# Patient Record
Sex: Female | Born: 1970 | Race: White | Hispanic: Yes | Marital: Married | Smoking: Former smoker
Health system: Southern US, Community
[De-identification: ages and names within clinical notes are randomized; demographics above are authoritative.]

## PROBLEM LIST (undated history)

## (undated) DIAGNOSIS — K8020 Calculus of gallbladder without cholecystitis without obstruction: Secondary | ICD-10-CM

## (undated) DIAGNOSIS — Z789 Other specified health status: Secondary | ICD-10-CM

## (undated) DIAGNOSIS — E669 Obesity, unspecified: Secondary | ICD-10-CM

## (undated) HISTORY — PX: TUBAL LIGATION: SHX77

## (undated) HISTORY — DX: Obesity, unspecified: E66.9

---

## 2001-11-18 ENCOUNTER — Emergency Department (HOSPITAL_COMMUNITY): Admission: EM | Admit: 2001-11-18 | Discharge: 2001-11-19 | Payer: Self-pay | Admitting: Emergency Medicine

## 2001-12-04 ENCOUNTER — Encounter: Payer: Self-pay | Admitting: Emergency Medicine

## 2001-12-04 ENCOUNTER — Emergency Department (HOSPITAL_COMMUNITY): Admission: EM | Admit: 2001-12-04 | Discharge: 2001-12-04 | Payer: Self-pay | Admitting: Emergency Medicine

## 2002-07-15 ENCOUNTER — Emergency Department (HOSPITAL_COMMUNITY): Admission: EM | Admit: 2002-07-15 | Discharge: 2002-07-15 | Payer: Self-pay | Admitting: Emergency Medicine

## 2003-07-16 ENCOUNTER — Emergency Department (HOSPITAL_COMMUNITY): Admission: EM | Admit: 2003-07-16 | Discharge: 2003-07-16 | Payer: Self-pay | Admitting: Emergency Medicine

## 2003-08-17 ENCOUNTER — Other Ambulatory Visit: Admission: RE | Admit: 2003-08-17 | Discharge: 2003-08-17 | Payer: Self-pay | Admitting: Obstetrics and Gynecology

## 2004-02-10 ENCOUNTER — Inpatient Hospital Stay (HOSPITAL_COMMUNITY): Admission: AD | Admit: 2004-02-10 | Discharge: 2004-02-13 | Payer: Self-pay | Admitting: Obstetrics and Gynecology

## 2004-12-09 ENCOUNTER — Emergency Department (HOSPITAL_COMMUNITY): Admission: EM | Admit: 2004-12-09 | Discharge: 2004-12-09 | Payer: Self-pay | Admitting: Emergency Medicine

## 2005-06-17 ENCOUNTER — Other Ambulatory Visit: Admission: RE | Admit: 2005-06-17 | Discharge: 2005-06-17 | Payer: Self-pay | Admitting: Obstetrics and Gynecology

## 2005-06-18 ENCOUNTER — Emergency Department (HOSPITAL_COMMUNITY): Admission: EM | Admit: 2005-06-18 | Discharge: 2005-06-18 | Payer: Self-pay | Admitting: Emergency Medicine

## 2005-12-24 ENCOUNTER — Inpatient Hospital Stay (HOSPITAL_COMMUNITY): Admission: AD | Admit: 2005-12-24 | Discharge: 2005-12-26 | Payer: Self-pay | Admitting: Obstetrics and Gynecology

## 2005-12-25 ENCOUNTER — Encounter (INDEPENDENT_AMBULATORY_CARE_PROVIDER_SITE_OTHER): Payer: Self-pay | Admitting: Specialist

## 2006-03-18 ENCOUNTER — Emergency Department (HOSPITAL_COMMUNITY): Admission: EM | Admit: 2006-03-18 | Discharge: 2006-03-18 | Payer: Self-pay | Admitting: Emergency Medicine

## 2007-12-22 ENCOUNTER — Emergency Department (HOSPITAL_COMMUNITY): Admission: EM | Admit: 2007-12-22 | Discharge: 2007-12-22 | Payer: Self-pay | Admitting: Emergency Medicine

## 2008-05-18 ENCOUNTER — Emergency Department (HOSPITAL_COMMUNITY): Admission: EM | Admit: 2008-05-18 | Discharge: 2008-05-18 | Payer: Self-pay | Admitting: Emergency Medicine

## 2009-12-14 ENCOUNTER — Emergency Department (HOSPITAL_COMMUNITY): Admission: EM | Admit: 2009-12-14 | Discharge: 2009-12-14 | Payer: Self-pay | Admitting: Emergency Medicine

## 2009-12-14 ENCOUNTER — Emergency Department (HOSPITAL_COMMUNITY)
Admission: EM | Admit: 2009-12-14 | Discharge: 2009-12-14 | Payer: Self-pay | Source: Home / Self Care | Admitting: Emergency Medicine

## 2010-06-07 LAB — COMPREHENSIVE METABOLIC PANEL
AST: 23 U/L (ref 0–37)
Albumin: 3.3 g/dL — ABNORMAL LOW (ref 3.5–5.2)
Calcium: 8.9 mg/dL (ref 8.4–10.5)
Creatinine, Ser: 0.71 mg/dL (ref 0.4–1.2)
GFR calc Af Amer: >60 mL/min (ref >60)
Total Protein: 6.8 g/dL (ref 6.0–8.3)

## 2010-06-07 LAB — URINALYSIS, ROUTINE W REFLEX MICROSCOPIC
Bilirubin Urine: NEGATIVE
Glucose, UA: NEGATIVE mg/dL
Protein, ur: NEGATIVE mg/dL
Specific Gravity, Urine: 1.013 (ref 1.005–1.030)

## 2010-06-07 LAB — POCT PREGNANCY, URINE: Preg Test, Ur: NEGATIVE

## 2010-06-07 LAB — LIPASE, BLOOD: Lipase: 25 U/L (ref 11–59)

## 2010-06-07 LAB — DIFFERENTIAL
Eosinophils Relative: 1 % (ref 0–5)
Lymphocytes Relative: 16 % (ref 12–46)
Lymphs Abs: 1.4 10*3/uL (ref 0.7–4.0)
Monocytes Absolute: 0.5 10*3/uL (ref 0.1–1.0)
Monocytes Relative: 5 % (ref 3–12)

## 2010-06-07 LAB — URINE MICROSCOPIC-ADD ON

## 2010-06-07 LAB — CBC
MCH: 33.9 pg (ref 26.0–34.0)
MCHC: 35.1 g/dL (ref 30.0–36.0)
Platelets: 285 10*3/uL (ref 150–400)

## 2010-08-10 NOTE — Discharge Summary (Signed)
[name], [name]               ACCOUNT NO.:  [id number]   MEDICAL RECORD NO.:  [id number]          PATIENT TYPE:  INP   LOCATION:  9113                          [facility]ILITY:  [facility]   PHYSICIAN:  Zenaida Niece, M.D.DATE OF BIRTH:  1971/02/18   DATE OF ADMISSION:  02/10/2004  DATE OF DISCHARGE:                                 DISCHARGE SUMMARY   ADMISSION DIAGNOSES:  1.  Intrauterine pregnancy at 39 weeks.  2.  Positive group B streptococcus carrier.   DISCHARGE DIAGNOSES:  1.  Intrauterine pregnancy at 39 weeks.  2.  Positive group B streptococcus carrier.   PROCEDURES:  On February 11, 2004 she had a spontaneous vaginal delivery.   HISTORY AND PHYSICAL:  This is a 40 year old [place] female gravida 3 para  2-0-0-2 with an EGA of [redacted] weeks who presents with a complaint of regular  contractions without bleeding or rupture of membranes and with good fetal  movement.  Evaluation in triage revealed her to be 6 cm dilated with  contractions every 3-4 minutes.  Prenatal care complicated by Trichomonas on  Pap smear confirmed by exam which was treated with Flagyl, and hematuria  with a negative urine culture.  Prenatal labs:  Blood type is A positive  with a negative antibody screen, RPR nonreactive, rubella equivocal,  hepatitis B surface antigen negative, gonorrhea and chlamydia negative,  triple screen normal, 1-hour Glucola 105, and group B strep is positive.  Past OB history:  In 1995, vaginal delivery at 40 weeks, 7 pounds 11 ounces,  no complications.  In 1998, vaginal delivery at 40 weeks, 7 pounds 11  ounces, no complications.  GYN history:  She had a Norplant which was  removed approximately 1 year ago.  Past medical history:  Migraine  headaches.  Physical exam:  She is afebrile with stable vital signs.  Fetal  heart tracing reactive with contractions every 3 minutes.  Abdomen gravid,  nontender, with an estimated fetal weight of 9 pounds.  Vaginal exam per the  nurse on  labor and delivery was 9, complete, and -1 with a bulging bag of  water and a vertex presentation.  Exam on admission was 6 cm dilated.   HOSPITAL COURSE:  The patient was admitted and continued to labor on her  own.  She was started on penicillin for group B strep prophylaxis.  She  progressed to complete and had spontaneous rupture of membranes with  moderate meconium.  She had a prolonged deceleration to the 60s just prior  to delivery.  She pushed very well.  She had a vaginal delivery of a viable  female infant with Apgars of 2 and 8 that weighed 7 pounds 14 ounces.  There  was a tight nuchal cord x1 which was clamped and cut on the perineum.  DeLee  and bulb suction were performed on the perineum without meconium.  Dr.  Mikle Bosworth was in attendance.  Placenta delivered spontaneous and was intact.  Estimated blood loss was 600 mL.  She had two small first degree lacerations  which were hemostatic and not repaired.  The baby  did go to the newborn  nursery early due to grunting.  Postpartum, the patient had no significant  complications.  Predelivery hemoglobin was 13.7, postdelivery 11.1.  On the  morning of postpartum day #2 she was felt to be stable enough for discharge  home.   DISCHARGE INSTRUCTIONS:  Regular diet, pelvic rest, follow-up is in 6 weeks.  Medications are over-the-counter ibuprofen as needed, and she is given our  discharge pamphlet.     Todd   TDM/MEDQ  D:  02/13/2004  T:  02/13/2004  Job:  [id number]

## 2010-08-10 NOTE — Op Note (Signed)
NAMESELAH, [name]               ACCOUNT NO.:  [id number]   MEDICAL RECORD NO.:  [id number]          PATIENT TYPE:  INP   LOCATION:  9113                          [facility]   PHYSICIAN:  Malachi Pro. Ambrose Mantle, M.D. DATE OF BIRTH:  1970/09/21   DATE OF PROCEDURE:  12/25/2005  DATE OF DISCHARGE:                                 OPERATIVE REPORT   PREOPERATIVE DIAGNOSIS:  Voluntary sterilization.   POSTOPERATIVE DIAGNOSIS:  Voluntary sterilization.   OPERATION:  Bilateral tubal ligation.   OPERATOR:  Malachi Pro. Ambrose Mantle, M.D.   ANESTHESIA:  General anesthesia.   The patient was brought to the operating room and placed under satisfactory  general anesthesia. The abdomen was prepped with Betadine solution and  draped as a sterile field. A semilunar incision was made in the inferior  portion of the umbilicus and carried through the skin and subcutaneous  tissue.  The fascia was identified and drawn up with Kocher clamps.  The  fascia was incised and extended laterally. The peritoneum was entered by  sharp dissection. Both tubes were identified and traced to their fimbriated  ends.  The ovaries were not seen well.  The mid portion of each tube was  grasped with a Babcock clamp.  A small incision was made in the mesosalpinx  with the Bovie and then two ties of 0 plain catgut were placed proximally  and distally on each tube and then a segment of tube was excised and  preserved for pathology. The segment on the right was about 1.5 to 2 cm  long, on the left was probably just over 1 cm long.  There was no bleeding  from the mesosalpinx.  A sponge had been placed for better visualization  around the left tube, it was removed. The abdominal wall was closed with  interrupted figure-of-eight sutures of 0 Vicryl in the fascia including some  of the peritoneum, the subcu tissue was closed with interrupted 3-0 Vicryl,  and the skin was closed with automatic staples.  The patient seemed to  tolerate the procedure well and was returned to recovery in satisfactory  condition.  Specimens were sent to pathology.      Malachi Pro. Ambrose Mantle, M.D.  Electronically Signed     TFH/MEDQ  D:  12/25/2005  T:  12/26/2005  Job:  [id number]

## 2010-08-10 NOTE — H&P (Signed)
[name], [name]               ACCOUNT NO.:  [id number]   MEDICAL RECORD NO.:  [id number]          PATIENT TYPE:  INP   LOCATION:  9161                          [facility]   PHYSICIAN:  Malachi Pro. Ambrose Mantle, M.D. DATE OF BIRTH:  [birth year]   DATE OF ADMISSION:  12/24/2005  DATE OF DISCHARGE:                                HISTORY & PHYSICAL   A 40 year old [place] married female, para 4-0-0-4 who requests  tubal ligation. The patient's prenatal lab work is in the chart. She had an  uncomplicated prenatal course. She was admitted to the hospital, progressed  through labor and delivered spontaneously a living female infant. She does  request tubal ligation.   PAST MEDICAL HISTORY:  No known allergies. No operations. She does have  migraines, gallstones, and had gestational diabetes with her last pregnancy.   ALCOHOL, TOBACCO AND DRUGS:  None.   FAMILY HISTORY:  Nothing of great significance in first-degree relatives.   OBSTETRICAL HISTORY:  The patient has now had four vaginal deliveries  without complications, October 1995 to October of 2007. Babies 7 pounds 11  ounces, 7 pounds 11 ounces, 7 pounds 14 ounces and the baby's weight just  delivered has not been recorded.   PHYSICAL EXAMINATION:  VITAL SIGNS:  Normal.  HEART AND LUNG:  Normal.  ABDOMEN:  Soft. Recently postpartum. Fundus above the umbilicus.   IMPRESSION:  Intrauterine pregnancy at 39 weeks, delivered, positive group B  strep treated with penicillin. The patient requests tubal ligation. She  understands the downside of tubal ligation, but she and her husband request  to proceed.      Malachi Pro. Ambrose Mantle, M.D.  Electronically Signed     TFH/MEDQ  D:  12/24/2005  T:  12/24/2005  Job:  [id number]

## 2010-08-10 NOTE — Discharge Summary (Signed)
[name], [name]               ACCOUNT NO.:  [id number]   MEDICAL RECORD NO.:  [id number]          PATIENT TYPE:  INP   LOCATION:  9113                          [facility]   PHYSICIAN:  Malachi Pro. Ambrose Mantle, M.D. DATE OF BIRTH:  13-Jan-1971   DATE OF ADMISSION:  12/24/2005  DATE OF DISCHARGE:  12/26/2005                                 DISCHARGE SUMMARY   This is a 40 year old [place] married female, para 3-0-0-3, gravida  4, with EDC of December 31, 2005, admitted in early labor.  Prenatal labs and  prenatal course are dictated in her history and physical.  In summary, she  had an uncomplicated prenatal course.  She admitted herself in labor.  She  was treated with penicillin for positive Group B strep.   PAST MEDICAL HISTORY:  1. NO KNOWN ALLERGIES.  2. No operations.  3. Migraines.  4. Gallstones.  5. Gestational diabetes mellitus with her last pregnancy.   ALCOHOL TOBACCO AND DRUGS:  None.   FAMILY HISTORY:  No significant family history in close relatives.   She had 3 vaginal deliveries in October 1995, May 1998, and November 2005  with delivery of 2 males and a female, all vaginally without complications.   ADMISSION PHYSICAL EXAMINATION:  VITAL SIGNS:  Were normal.  HEART:  Normal.  LUNGS:  Normal.  ABDOMEN:  Soft.  PELVIC:  The cervix was 5-to-6-cm dilated.  Artifical rupture of membranes  produced clear fluid.   She progressed to full dilatation with 2 contractions, delivered a living  female infant, 8 pounds 3 ounces, with Apgar's of 9 at one and 9 at five  minutes.  Spontaneously OA over an intact perineum.  Placenta was intact.  The uterus was normal.  Blood loss about 400 cc.  The patient requested  tubal ligation.  Dr. Ambrose Mantle was in attendance.  She underwent a bilateral  partial salpingectomy on December 25, 2005, under general anesthesia, did well  postoperatively and was discharged on the first post-op day.   Initial hemoglobin was 12.1,  hematocrit 34.8, white count 6200, platelet  count 291,000.  Followup hemoglobin 11.2.  RPR was nonreactive.   FINAL DIAGNOSES:  1. Intrauterine pregnancy at 39 weeks, delivered OA.  2. Positive Group B strep.  3. Voluntary sterilization.   OPERATIONS:  1. Spontaneous delivery OA.  2. Bilateral tubal ligation .   FINAL CONDITION:  Improved.   INSTRUCTIONS:  Include our regular discharge instruction booklet.   Percocet 5/325, twenty-four tablets, one every 4-6 hours as needed for pain  was given at discharge.   The patient is advised to return in 2 weeks for followup examination.   Blood group and type was A positive with a negative antibody.  Nonreactive  serology.  Rubella immune.  Hepatitis B surface antigen negative.  HIV  negative.  GC and Chlamydia negative.  Triple screen negative.  One-hour  Glucola 148; 3-hour GTT 87, 150, 97, and 51.  Group B strep was positive.      Malachi Pro. Ambrose Mantle, M.D.  Electronically Signed     TFH/MEDQ  D:  12/26/2005  T:  12/27/2005  Job:  [id number]

## 2010-12-24 LAB — BASIC METABOLIC PANEL
BUN: 12
Chloride: 106
Glucose, Bld: 91
Potassium: 3.6

## 2010-12-24 LAB — CBC
HCT: 36.7
MCV: 96.8
Platelets: 312
WBC: 5

## 2010-12-24 LAB — POCT CARDIAC MARKERS: Myoglobin, poc: 36.5

## 2010-12-24 LAB — DIFFERENTIAL
Eosinophils Absolute: 0.1
Eosinophils Relative: 2
Lymphs Abs: 2.2
Monocytes Relative: 7

## 2011-02-19 ENCOUNTER — Ambulatory Visit
Admission: RE | Admit: 2011-02-19 | Discharge: 2011-02-19 | Disposition: A | Discharge status: Home / Self Care | Payer: BC Managed Care – PPO | Source: Ambulatory Visit | Attending: Internal Medicine | Admitting: Internal Medicine

## 2011-02-19 ENCOUNTER — Other Ambulatory Visit: Payer: Self-pay | Admitting: Internal Medicine

## 2011-02-19 DIAGNOSIS — R1011 Right upper quadrant pain: Secondary | ICD-10-CM

## 2011-02-20 ENCOUNTER — Encounter (HOSPITAL_COMMUNITY)
Admission: EM | Disposition: A | Discharge status: Home / Self Care | Payer: Self-pay | Source: Home / Self Care | Attending: Emergency Medicine

## 2011-02-20 ENCOUNTER — Emergency Department (HOSPITAL_COMMUNITY): Payer: BC Managed Care – PPO | Admitting: Anesthesiology

## 2011-02-20 ENCOUNTER — Encounter: Payer: Self-pay | Admitting: *Deleted

## 2011-02-20 ENCOUNTER — Encounter (HOSPITAL_COMMUNITY): Payer: Self-pay | Admitting: General Practice

## 2011-02-20 ENCOUNTER — Other Ambulatory Visit (INDEPENDENT_AMBULATORY_CARE_PROVIDER_SITE_OTHER): Payer: Self-pay | Admitting: Surgery

## 2011-02-20 ENCOUNTER — Observation Stay (HOSPITAL_COMMUNITY)
Admission: EM | Admit: 2011-02-20 | Discharge: 2011-02-21 | DRG: 493 | Disposition: A | Discharge status: Home / Self Care | Payer: BC Managed Care – PPO | Attending: Surgery | Admitting: Surgery

## 2011-02-20 ENCOUNTER — Emergency Department (HOSPITAL_COMMUNITY): Payer: BC Managed Care – PPO

## 2011-02-20 ENCOUNTER — Encounter (HOSPITAL_COMMUNITY): Payer: Self-pay | Admitting: Anesthesiology

## 2011-02-20 DIAGNOSIS — K8000 Calculus of gallbladder with acute cholecystitis without obstruction: Principal | ICD-10-CM | POA: Insufficient documentation

## 2011-02-20 DIAGNOSIS — K810 Acute cholecystitis: Secondary | ICD-10-CM

## 2011-02-20 DIAGNOSIS — Z6837 Body mass index (BMI) 37.0-37.9, adult: Secondary | ICD-10-CM | POA: Insufficient documentation

## 2011-02-20 DIAGNOSIS — N390 Urinary tract infection, site not specified: Secondary | ICD-10-CM | POA: Insufficient documentation

## 2011-02-20 DIAGNOSIS — R1011 Right upper quadrant pain: Secondary | ICD-10-CM | POA: Insufficient documentation

## 2011-02-20 DIAGNOSIS — E669 Obesity, unspecified: Secondary | ICD-10-CM | POA: Insufficient documentation

## 2011-02-20 DIAGNOSIS — K8020 Calculus of gallbladder without cholecystitis without obstruction: Secondary | ICD-10-CM

## 2011-02-20 DIAGNOSIS — K8010 Calculus of gallbladder with chronic cholecystitis without obstruction: Secondary | ICD-10-CM

## 2011-02-20 HISTORY — PX: CHOLECYSTECTOMY: SHX55

## 2011-02-20 HISTORY — DX: Other specified health status: Z78.9

## 2011-02-20 LAB — CBC
HCT: 35.5 % — ABNORMAL LOW (ref 36.0–46.0)
Hemoglobin: 12 g/dL (ref 12.0–15.0)
MCH: 32 pg (ref 26.0–34.0)
MCV: 94.7 fL (ref 78.0–100.0)
RBC: 3.75 MIL/uL — ABNORMAL LOW (ref 3.87–5.11)

## 2011-02-20 LAB — COMPREHENSIVE METABOLIC PANEL
BUN: 12 mg/dL (ref 6–23)
Calcium: 8.4 mg/dL (ref 8.4–10.5)
GFR calc Af Amer: >90 mL/min (ref >90)
Glucose, Bld: 93 mg/dL (ref 70–99)
Total Protein: 6.9 g/dL (ref 6.0–8.3)

## 2011-02-20 LAB — DIFFERENTIAL
Eosinophils Absolute: 0.2 10*3/uL (ref 0.0–0.7)
Eosinophils Relative: 4 % (ref 0–5)
Lymphs Abs: 2.4 10*3/uL (ref 0.7–4.0)
Monocytes Absolute: 0.5 10*3/uL (ref 0.1–1.0)
Monocytes Relative: 8 % (ref 3–12)

## 2011-02-20 LAB — URINALYSIS, ROUTINE W REFLEX MICROSCOPIC
Bilirubin Urine: NEGATIVE
Nitrite: NEGATIVE
Protein, ur: NEGATIVE mg/dL
Specific Gravity, Urine: 1.025 (ref 1.005–1.030)
Urobilinogen, UA: 1 mg/dL (ref 0.0–1.0)

## 2011-02-20 LAB — URINE MICROSCOPIC-ADD ON

## 2011-02-20 LAB — LIPASE, BLOOD: Lipase: 34 U/L (ref 11–59)

## 2011-02-20 SURGERY — LAPAROSCOPIC CHOLECYSTECTOMY WITH INTRAOPERATIVE CHOLANGIOGRAM
Anesthesia: General | Site: Abdomen | Wound class: Clean Contaminated

## 2011-02-20 MED ORDER — HYDROCODONE-ACETAMINOPHEN 5-325 MG PO TABS
1.0000 | ORAL_TABLET | ORAL | Status: DC | PRN
  Administered 2011-02-20 – 2011-02-21 (×2): 2 via ORAL
  Filled 2011-02-20 (×2): qty 2

## 2011-02-20 MED ORDER — GLYCOPYRROLATE 0.2 MG/ML IJ SOLN
INTRAMUSCULAR | Status: DC | PRN
  Administered 2011-02-20: .4 mg via INTRAVENOUS

## 2011-02-20 MED ORDER — ROCURONIUM BROMIDE 100 MG/10ML IV SOLN
INTRAVENOUS | Status: DC | PRN
  Administered 2011-02-20: 10 mg via INTRAVENOUS
  Administered 2011-02-20: 40 mg via INTRAVENOUS

## 2011-02-20 MED ORDER — ONDANSETRON HCL 4 MG/2ML IJ SOLN: 4.0000 mg | Freq: Four times a day (QID) | INTRAMUSCULAR | Status: DC | PRN

## 2011-02-20 MED ORDER — SODIUM CHLORIDE 0.9 % IR SOLN
Status: DC | PRN
  Administered 2011-02-20 (×2): 1000 mL

## 2011-02-20 MED ORDER — INFLUENZA VIRUS VACC SPLIT PF IM SUSP
0.5000 mL | INTRAMUSCULAR | Status: DC
  Filled 2011-02-20: qty 0.5

## 2011-02-20 MED ORDER — MIDAZOLAM HCL 5 MG/5ML IJ SOLN
INTRAMUSCULAR | Status: DC | PRN
  Administered 2011-02-20: 2 mg via INTRAVENOUS

## 2011-02-20 MED ORDER — LACTATED RINGERS IV SOLN
INTRAVENOUS | Status: DC | PRN
  Administered 2011-02-20 (×2): via INTRAVENOUS

## 2011-02-20 MED ORDER — ACETAMINOPHEN 500 MG PO TABS
1000.0000 mg | ORAL_TABLET | Freq: Four times a day (QID) | ORAL | Status: AC | PRN
Start: 2011-02-20 — End: 2011-03-02

## 2011-02-20 MED ORDER — MORPHINE SULFATE 2 MG/ML IJ SOLN
1.0000 mg | INTRAMUSCULAR | Status: DC | PRN
  Administered 2011-02-20: 2 mg via INTRAVENOUS
  Filled 2011-02-20: qty 1

## 2011-02-20 MED ORDER — SODIUM CHLORIDE 0.9 % IV SOLN
999.0000 mL | INTRAVENOUS | Status: DC
  Administered 2011-02-20 (×2): 1000 mL via INTRAVENOUS

## 2011-02-20 MED ORDER — HYDROMORPHONE HCL PF 1 MG/ML IJ SOLN: 0.2500 mg | INTRAMUSCULAR | Status: DC | PRN

## 2011-02-20 MED ORDER — ONDANSETRON HCL 4 MG/2ML IJ SOLN
4.0000 mg | Freq: Once | INTRAMUSCULAR | Status: AC
  Administered 2011-02-20: 4 mg via INTRAVENOUS
  Filled 2011-02-20: qty 2

## 2011-02-20 MED ORDER — HEPARIN SODIUM (PORCINE) 5000 UNIT/ML IJ SOLN
5000.0000 [IU] | Freq: Three times a day (TID) | INTRAMUSCULAR | Status: DC
  Administered 2011-02-20 – 2011-02-21 (×2): 5000 [IU] via SUBCUTANEOUS
  Filled 2011-02-20 (×5): qty 1

## 2011-02-20 MED ORDER — HYDROMORPHONE HCL PF 1 MG/ML IJ SOLN
0.5000 mg | INTRAMUSCULAR | Status: DC | PRN
  Administered 2011-02-20: 1 mg via INTRAVENOUS
  Filled 2011-02-20: qty 1

## 2011-02-20 MED ORDER — BUPIVACAINE HCL (PF) 0.25 % IJ SOLN
INTRAMUSCULAR | Status: DC | PRN
  Administered 2011-02-20: 20 mL
  Administered 2011-02-20: 5 mL

## 2011-02-20 MED ORDER — KCL IN DEXTROSE-NACL 20-5-0.45 MEQ/L-%-% IV SOLN
INTRAVENOUS | Status: DC
  Filled 2011-02-20 (×3): qty 1000

## 2011-02-20 MED ORDER — ONDANSETRON HCL 4 MG/2ML IJ SOLN
INTRAMUSCULAR | Status: DC | PRN
  Administered 2011-02-20: 4 mg via INTRAVENOUS

## 2011-02-20 MED ORDER — CIPROFLOXACIN IN D5W 400 MG/200ML IV SOLN
400.0000 mg | Freq: Two times a day (BID) | INTRAVENOUS | Status: DC
  Administered 2011-02-20: 400 mg via INTRAVENOUS
  Filled 2011-02-20 (×2): qty 200

## 2011-02-20 MED ORDER — HYDROMORPHONE HCL PF 1 MG/ML IJ SOLN
1.0000 mg | Freq: Once | INTRAMUSCULAR | Status: AC
  Administered 2011-02-20: 1 mg via INTRAVENOUS
  Filled 2011-02-20: qty 1

## 2011-02-20 MED ORDER — PROMETHAZINE HCL 25 MG/ML IJ SOLN: 6.2500 mg | INTRAMUSCULAR | Status: DC | PRN

## 2011-02-20 MED ORDER — HYDROCODONE-ACETAMINOPHEN 5-325 MG PO TABS: 1.0000 | ORAL_TABLET | ORAL | Status: AC | PRN

## 2011-02-20 MED ORDER — PROPOFOL 10 MG/ML IV EMUL
INTRAVENOUS | Status: DC | PRN
  Administered 2011-02-20: 50 mg via INTRAVENOUS
  Administered 2011-02-20: 150 mg via INTRAVENOUS

## 2011-02-20 MED ORDER — IOHEXOL 300 MG/ML  SOLN
INTRAMUSCULAR | Status: DC | PRN
  Administered 2011-02-20: 10 mL

## 2011-02-20 MED ORDER — ONDANSETRON HCL 4 MG PO TABS: 4.0000 mg | ORAL_TABLET | Freq: Four times a day (QID) | ORAL | Status: DC | PRN

## 2011-02-20 MED ORDER — HYDROMORPHONE HCL PF 1 MG/ML IJ SOLN
0.2500 mg | INTRAMUSCULAR | Status: DC | PRN
  Administered 2011-02-20: 0.5 mg via INTRAVENOUS

## 2011-02-20 MED ORDER — LACTATED RINGERS IV SOLN
INTRAVENOUS | Status: DC
  Administered 2011-02-20: 11:00:00 via INTRAVENOUS

## 2011-02-20 MED ORDER — NEOSTIGMINE METHYLSULFATE 1 MG/ML IJ SOLN
INTRAMUSCULAR | Status: DC | PRN
  Administered 2011-02-20: 3.5 mg via INTRAVENOUS

## 2011-02-20 MED ORDER — POTASSIUM CHLORIDE IN NACL 20-0.45 MEQ/L-% IV SOLN
INTRAVENOUS | Status: DC
  Administered 2011-02-20: 17:00:00 via INTRAVENOUS
  Filled 2011-02-20 (×4): qty 1000

## 2011-02-20 MED ORDER — FENTANYL CITRATE 0.05 MG/ML IJ SOLN
INTRAMUSCULAR | Status: DC | PRN
  Administered 2011-02-20 (×3): 50 ug via INTRAVENOUS

## 2011-02-20 MED ORDER — ONDANSETRON HCL 4 MG/2ML IJ SOLN
4.0000 mg | Freq: Four times a day (QID) | INTRAMUSCULAR | Status: DC | PRN
  Administered 2011-02-20: 4 mg via INTRAVENOUS
  Filled 2011-02-20: qty 2

## 2011-02-20 SURGICAL SUPPLY — 45 items
ADH SKN CLS APL DERMABOND .7 (GAUZE/BANDAGES/DRESSINGS) ×1
APPLIER CLIP ROT 10 11.4 M/L (STAPLE) ×2
APR CLP MED LRG 11.4X10 (STAPLE) ×1
BAG SPEC RTRVL LRG 6X4 10 (ENDOMECHANICALS) ×1
BLADE SURG ROTATE 9660 (MISCELLANEOUS) IMPLANT
CANISTER SUCTION 2500CC (MISCELLANEOUS) ×2 IMPLANT
CHLORAPREP W/TINT 26ML (MISCELLANEOUS) ×2 IMPLANT
CHOLANGIOGRAM CATH TAUT (CATHETERS) ×2 IMPLANT
CLIP APPLIE ROT 10 11.4 M/L (STAPLE) ×1 IMPLANT
CLOTH BEACON ORANGE TIMEOUT ST (SAFETY) ×2 IMPLANT
COVER MAYO STAND STRL (DRAPES) ×2 IMPLANT
COVER SURGICAL LIGHT HANDLE (MISCELLANEOUS) ×2 IMPLANT
DECANTER SPIKE VIAL GLASS SM (MISCELLANEOUS) ×2 IMPLANT
DERMABOND ADVANCED (GAUZE/BANDAGES/DRESSINGS) ×1
DERMABOND ADVANCED .7 DNX12 (GAUZE/BANDAGES/DRESSINGS) ×1 IMPLANT
DRAPE C-ARM 42X72 X-RAY (DRAPES) ×2 IMPLANT
ELECT REM PT RETURN 9FT ADLT (ELECTROSURGICAL) ×2
ELECTRODE REM PT RTRN 9FT ADLT (ELECTROSURGICAL) ×1 IMPLANT
FILTER SMOKE EVAC LAPAROSHD (FILTER) ×2 IMPLANT
GLOVE BIO SURGEON STRL SZ7.5 (GLOVE) ×2 IMPLANT
GLOVE BIOGEL PI IND STRL 7.5 (GLOVE) IMPLANT
GLOVE BIOGEL PI INDICATOR 7.5 (GLOVE) ×2
GLOVE SURG SIGNA 7.5 PF LTX (GLOVE) ×4 IMPLANT
GOWN STRL NON-REIN LRG LVL3 (GOWN DISPOSABLE) ×4 IMPLANT
GOWN STRL REIN XL XLG (GOWN DISPOSABLE) ×3 IMPLANT
IV CATH 14GX2 1/4 (CATHETERS) ×2 IMPLANT
KIT BASIN OR (CUSTOM PROCEDURE TRAY) ×2 IMPLANT
KIT ROOM TURNOVER OR (KITS) ×2 IMPLANT
NS IRRIG 1000ML POUR BTL (IV SOLUTION) ×2 IMPLANT
PAD ARMBOARD 7.5X6 YLW CONV (MISCELLANEOUS) ×2 IMPLANT
POUCH SPECIMEN RETRIEVAL 10MM (ENDOMECHANICALS) ×2 IMPLANT
SCISSORS LAP 5X35 DISP (ENDOMECHANICALS) IMPLANT
SET IRRIG TUBING LAPAROSCOPIC (IRRIGATION / IRRIGATOR) ×2 IMPLANT
SLEEVE Z-THREAD 5X100MM (TROCAR) ×2 IMPLANT
SPECIMEN JAR SMALL (MISCELLANEOUS) ×2 IMPLANT
STOPCOCK 4 WAY LG BORE MALE ST (IV SETS) ×2 IMPLANT
SUT VIC AB 5-0 PS2 18 (SUTURE) ×2 IMPLANT
TOWEL OR 17X24 6PK STRL BLUE (TOWEL DISPOSABLE) ×2 IMPLANT
TOWEL OR 17X26 10 PK STRL BLUE (TOWEL DISPOSABLE) ×2 IMPLANT
TRAY LAPAROSCOPIC (CUSTOM PROCEDURE TRAY) ×2 IMPLANT
TROCAR XCEL BLUNT TIP 100MML (ENDOMECHANICALS) ×2 IMPLANT
TROCAR Z-THREAD FIOS 11X100 BL (TROCAR) ×2 IMPLANT
TROCAR Z-THREAD FIOS 5X100MM (TROCAR) ×2 IMPLANT
TUBING EXTENTION W/L.L. (IV SETS) ×2 IMPLANT
WATER STERILE IRR 1000ML POUR (IV SOLUTION) IMPLANT

## 2011-02-20 NOTE — Op Note (Signed)
[name], [name]               ACCOUNT NO.:  [id number]  MEDICAL RECORD NO.:  [id number]  LOCATION:  5127                         FACILITY:  [facility]  PHYSICIAN:  Sandria Bales. Ezzard Standing, M.D.  DATE OF BIRTH:  November 29, 1970  DATE OF PROCEDURE:  02/20/2011                               OPERATIVE REPORT  PREOPERATIVE DIAGNOSIS:  Cholelithiasis with acute cholecystitis.  POSTOPERATIVE DIAGNOSIS:  Cholecystolithiasis with cholecystitis.  PROCEDURE:  Laparoscopic cholecystectomy with intraoperative cholangiogram.  SURGEON:  Sandria Bales. Ezzard Standing, M.D.  FIRST ASSISTANT:  Angelia Mould. Derrell Lolling, M.D.  ANESTHESIA:  General endotracheal, supervised by Dr. Diamantina Monks and 25 mL of 0.25% Marcaine.  COMPLICATION:  None.  INDICATION FOR PROCEDURE:  [name] is a 40 year old [place] female, who has no primary care doctor, comes with symptomatic cholelithiasis.  She has had known gallstones for some 10 years.  Because of financial reasons, she has not been able to afford care of this gallbladder disease.  I discussed with her the indications, potential risks of surgery.  The potential risks of surgery include, but are not limited to, bleeding, infection, open surgery, common bile duct injury.  OPERATIVE NOTE:  The patient placed in the supine position, given a general endotracheal anesthetic.  She was supervised by Dr. Diamantina Monks in room #17.  A time-out was held, surgical checklist run.  Her abdomen was prepped with ChloraPrep and sterilely draped.  An infraumbilical incision made with sharp dissection carried down to the abdominal cavity.  A 0 degree 10 mm laparoscope was inserted through a 12-mm Hasson trocar.  The Hasson trocar was secured with a 0 Vicryl suture. Abdominal exploration carried out.  She had some Lynnae January adhesions over the right lobe of the liver.  Her liver was otherwise unremarkable.  Her stomach was unremarkable.  The bowel that I could see was unremarkable.  I then turned my  attention to the gallbladder.  I placed 3 additional trocars, a 10 mm trocar in the subxiphoid location, 5 mm trocar in the right mid subcostal location, and a 5 mm trocar in the right lateral subcostal location. I then grabbed the gallbladder, rotated it cephalad.  She had edema around her gallbladder, but not much in the way of adhesions. I dissected out and identified the cystic artery, which was clipped.  I placed a clip on the gallbladder, on the side of the cystic duct.  I then shot intraoperative cholangiogram.  Intraoperative cholangiogram was shot using a cutoff Taut catheter, inserted through a 16-gauge Jelco into the side of the cut cystic duct. The Taut catheter secured with an EndoClip.  I then shot a Cholangiogram using 1/2 strength Hyapaque contrast under fluoroscopy.  This showed free flow of contrast down the cystic duct, into the common bile duct up the hepatic radicals into the duodenum.  This was felt to be a normal intraoperative cholangiogram with no filling defect or obstruction.  The Taut catheter was then removed.  The cystic duct was triply EndoClipped and divided.  I then sharply and bluntly dissected the gallbladder from the gallbladder bed, and then placed the gallbladder in the EndoCatch bag.  The gallbladder was delivered through the umbilicus. She had  3 stones each about 1.5+ cm, enlarged by umbilical incision a little bit to get the stones out.  I then closed the umbilical incision with 3 interrupted 0 Vicryl sutures.  I then irrigated the abdomen out with about 7 or 800 mL of saline.  I revisualized the gallbladder bed. Revisualized the triangle of Calot.  There was no bleeding, no bile leak.  The trocars were then removed.  The skin at each trocar site closed with 5-0 Vicryl suture, painted with Dermabond, sterilely dressed.  T  Sponge and needle count were correct.  The patient tolerated the procedure well, was transported to recovery room in good  condition.   Sandria Bales. Ezzard Standing, M.D., FACS   DHN/MEDQ  D:  02/20/2011  T:  02/20/2011  Job:  [id number]

## 2011-02-20 NOTE — Preoperative (Signed)
Beta Blockers   Reason not to administer Beta Blockers:Not Applicable 

## 2011-02-20 NOTE — ED Notes (Signed)
Patient presents to ed c/o abd. Pain onset 3 days ago states she was seen at [facility] yest and dx. With "infected gallstones". C/o increased pain this am. Epigastric and into her right side. Denies n/v Rates pain at 7/10 presently

## 2011-02-20 NOTE — ED Notes (Signed)
Family at bedside. 

## 2011-02-20 NOTE — ED Notes (Signed)
Pt resting quietly with family at bedside.  Pt awaiting surgery and/or bed assignment.

## 2011-02-20 NOTE — H&P (Addendum)
[name] is an 40 y.o. female.   Chief Complaint: Abdominal pain right upper quadrant going to her back. HPI: Patient is a 40 year old Hispanic female who has a three-day history of abdominal pain. She's had these problems before and has known cholelithiasis. She actually been seen in the past for possible cholecystectomy, but they were unable to afford the cost. The current problem She is having is not unlike her previous episodes. She's had 3 days of discomfort and cleaned the emergency room. She would like to go forward with cholecystectomy at this time.  History reviewed. No pertinent past medical history.  History reviewed. No pertinent past surgical history. Laparoscopic tubal ligation 2007 Family History  Problem Relation Age of Onset  . Diabetes Mother   . Hypertension Mother    one sister with diabetes Social History:  reports that she has never smoked. She uses smokeless tobacco. She reports that she does not drink alcohol or use illicit drugs.  Allergies: No Known Allergies  Medications Prior to Admission  Medication Dose Route Frequency Provider Last Rate Last Dose  . 0.9 %  sodium chloride infusion  999 mL Intravenous Continuous Dione Booze, MD 100 mL/hr at 02/20/11 0653 1,000 mL at 02/20/11 0653  . HYDROmorphone (DILAUDID) injection 1 mg  1 mg Intravenous Once Dione Booze, MD   1 mg at 02/20/11 0617  . ondansetron (ZOFRAN) injection 4 mg  4 mg Intravenous Once Dione Booze, MD   4 mg at 02/20/11 0617   No current outpatient prescriptions on file as of 02/20/2011.    Results for orders placed during the hospital encounter of 02/20/11 (from the past 48 hour(s))  CBC     Status: Abnormal   Collection Time   02/20/11  5:35 AM      Component Value Range Comment   WBC 5.9  4.0 - 10.5 (K/uL)    RBC 3.75 (*) 3.87 - 5.11 (MIL/uL)    Hemoglobin 12.0  12.0 - 15.0 (g/dL)    HCT 16.1 (*) 09.6 - 46.0 (%)    MCV 94.7  78.0 - 100.0 (fL)    MCH 32.0  26.0 - 34.0 (pg)    MCHC  33.8  30.0 - 36.0 (g/dL)    RDW 04.5  40.9 - 81.1 (%)    Platelets 277  150 - 400 (K/uL)   DIFFERENTIAL     Status: Normal   Collection Time   02/20/11  5:35 AM      Component Value Range Comment   Neutrophils Relative 47  43 - 77 (%)    Neutro Abs 2.8  1.7 - 7.7 (K/uL)    Lymphocytes Relative 41  12 - 46 (%)    Lymphs Abs 2.4  0.7 - 4.0 (K/uL)    Monocytes Relative 8  3 - 12 (%)    Monocytes Absolute 0.5  0.1 - 1.0 (K/uL)    Eosinophils Relative 4  0 - 5 (%)    Eosinophils Absolute 0.2  0.0 - 0.7 (K/uL)    Basophils Relative 1  0 - 1 (%)    Basophils Absolute 0.0  0.0 - 0.1 (K/uL)   COMPREHENSIVE METABOLIC PANEL     Status: Abnormal   Collection Time   02/20/11  5:35 AM      Component Value Range Comment   Sodium 138  135 - 145 (mEq/L)    Potassium 3.8  3.5 - 5.1 (mEq/L)    Chloride 103  96 - 112 (mEq/L)  CO2 28  19 - 32 (mEq/L)    Glucose, Bld 93  70 - 99 (mg/dL)    BUN 12  6 - 23 (mg/dL)    Creatinine, Ser 1.61  0.50 - 1.10 (mg/dL)    Calcium 8.4  8.4 - 10.5 (mg/dL)    Total Protein 6.9  6.0 - 8.3 (g/dL)    Albumin 3.0 (*) 3.5 - 5.2 (g/dL)    AST 13  0 - 37 (U/L)    ALT 11  0 - 35 (U/L)    Alkaline Phosphatase 55  39 - 117 (U/L)    Total Bilirubin 0.2 (*) 0.3 - 1.2 (mg/dL)    GFR calc non Af Amer >90  >90 (mL/min)    GFR calc Af Amer >90  >90 (mL/min)   LIPASE, BLOOD     Status: Normal   Collection Time   02/20/11  5:35 AM      Component Value Range Comment   Lipase 34  11 - 59 (U/L)   URINALYSIS, ROUTINE W REFLEX MICROSCOPIC     Status: Abnormal   Collection Time   02/20/11  5:45 AM      Component Value Range Comment   Color, Urine YELLOW  YELLOW     Appearance CLOUDY (*) CLEAR     Specific Gravity, Urine 1.025  1.005 - 1.030     pH 6.0  5.0 - 8.0     Glucose, UA NEGATIVE  NEGATIVE (mg/dL)    Hgb urine dipstick LARGE (*) NEGATIVE     Bilirubin Urine NEGATIVE  NEGATIVE     Ketones, ur NEGATIVE  NEGATIVE (mg/dL)    Protein, ur NEGATIVE  NEGATIVE (mg/dL)     Urobilinogen, UA 1.0  0.0 - 1.0 (mg/dL)    Nitrite NEGATIVE  NEGATIVE     Leukocytes, UA LARGE (*) NEGATIVE    URINE MICROSCOPIC-ADD ON     Status: Abnormal   Collection Time   02/20/11  5:45 AM      Component Value Range Comment   Squamous Epithelial / LPF MANY (*) RARE     WBC, UA 11-20  <3 (WBC/hpf)    RBC / HPF 3-6  <3 (RBC/hpf)    Bacteria, UA MANY (*) RARE    POCT PREGNANCY, URINE     Status: Normal   Collection Time   02/20/11  5:54 AM      Component Value Range Comment   Preg Test, Ur NEGATIVE      US Abdomen Limited  02/19/2011  *RADIOLOGY REPORT*  Clinical Data:  Right upper quadrant pain for 2 days  LIMITED ABDOMINAL ULTRASOUND - RIGHT UPPER QUADRANT  Comparison:  Ultrasound of the abdomen of 12/14/2009  Findings:  Gallbladder:  There are several gallstones present with the largest measuring 2.0 cm.  The gallbladder wall is somewhat prominent and there is pain over gallbladder, findings suspicious for acute cholecystitis.  Common bile duct:  The common bile duct is normal measuring 3.0 mm in diameter.  Liver:  The liver has a normal echogenic pattern.  No ductal dilatation is seen.  IMPRESSION:   Several gallstones with somewhat thickened gallbladder wall and pain over the gallbladder with compression, findings which are suspicious for acute cholecystitis.                   Original Report Authenticated By: Juline Patch, M.D.    Review of Systems  Constitutional: Positive for weight loss (gaining weight). Negative for fever and chills.  HENT: Negative.   Eyes: Negative.   Respiratory: Negative.   Cardiovascular: Negative.   Gastrointestinal: Positive for heartburn and abdominal pain. Negative for nausea, vomiting, diarrhea, constipation and blood in stool.  Genitourinary: Positive for dysuria.  Musculoskeletal: Joint pain: hands and legs.  Skin: Negative.   Neurological: Positive for dizziness (intermittent).  Endo/Heme/Allergies: Negative.     Blood pressure 95/69,  pulse 66, temperature 98.2 F (36.8 C), temperature source Oral, resp. rate 17, last menstrual period 02/19/2011, SpO2 100.00%. Physical Exam  Constitutional: She is oriented to person, place, and time. She appears well-developed and well-nourished. No distress.  HENT:  Head: Normocephalic and atraumatic.  Eyes: Conjunctivae and EOM are normal. Pupils are equal, round, and reactive to light.  Neck: Normal range of motion. Neck supple. No JVD present. No tracheal deviation present. No thyromegaly present.  Cardiovascular: Normal rate, regular rhythm, normal heart sounds and intact distal pulses.   Respiratory: Effort normal and breath sounds normal.  GI: Soft. She exhibits no distension. There is tenderness (Mid epigastric pain, but worst in RUQ going to the back).  Genitourinary:       Pt. Started cycle today  Musculoskeletal: Normal range of motion. She exhibits no edema and no tenderness.  Lymphadenopathy:    She has no cervical adenopathy.  Neurological: She is alert and oriented to person, place, and time. She has normal reflexes. No cranial nerve deficit.  Skin: Skin is warm and dry. She is not diaphoretic.  Psychiatric: She has a normal mood and affect. Her behavior is normal. Judgment and thought content normal.     Assessment/Plan Cholelithiasis with probable cholecystitis. Probable UTI BMI 37 Plan: We'll admit the patient hydrate her. She has not eaten since 7:00 PM yesterday. Start antibiotics keep her n.p.o. for possible surgery today or tomorrow. Ricki Miller physician assistant for Dr. Ovidio Kin  JENNINGS,WILLARD 02/20/2011, 7:48 AM   Patient is from [place]. Her husband is at the bedside.  She's had know gall stones for > 10 years, but could not afford care.  Discussed findings with patient.  I discussed with the patient the indications and risks of gall bladder surgery.  The primary risks of gall bladder surgery include, but are not limited to, bleeding,  infection, common bile duct injury, and open surgery.  There is also the risk that the patient may have continued symptoms after surgery. I tried to answer the patient's questions.  Will try to do surgery later today.  D. Ezzard Standing 02/20/2011

## 2011-02-20 NOTE — ED Provider Notes (Signed)
History     CSN: [id number] Arrival date & time: 02/20/2011  4:41 AM   First MD Initiated Contact with Patient 02/20/11 [phone]      Chief Complaint  Patient presents with  . Abdominal Pain    RUQ pain, radiates around to R flank/back    (Consider location/radiation/quality/duration/timing/severity/associated sxs/prior treatment) HPI 40 year old female has been having right upper quadrant pain radiating to the back for the last 3 days. Pain is severe rated 10 out of 10. Nothing makes it better and nothing makes it worse. She was seen at an urgent care center and sent for an ultrasound which showed gallstones. She states that medication she was given has not helped her pain. History reviewed. No pertinent past medical history.  History reviewed. No pertinent past surgical history.  Family History  Problem Relation Age of Onset  . Diabetes Mother   . Hypertension Mother     History  Substance Use Topics  . Smoking status: Never Smoker   . Smokeless tobacco: Current User  . Alcohol Use: No    OB History    Grav Para Term Preterm Abortions TAB SAB Ect Mult Living                  Review of Systems  Allergies  Review of patient's allergies indicates no known allergies.  Home Medications   Current Outpatient Rx  Name Route Sig Dispense Refill  . MELOXICAM 15 MG PO TABS Oral Take 15 mg by mouth daily.      Marland Kitchen ONDANSETRON 8 MG PO TBDP Oral Take 8 mg by mouth every 8 (eight) hours as needed. For nausea       LMP 02/19/2011  Physical Exam 40 year old female appears uncomfortable. Vital signs are normal. Head is normocephalic and atraumatic. PERRLA, EOMI. There is no scleral icterus. Mucous members are moist. Neck is supple without adenopathy. Back is nontender and there is no CVA tenderness. Lungs are clear without rales, wheezes, rhonchi. Heart is regular rate and rhythm without murmur. Abdomen is soft and flat with moderate right upper quadrant tenderness. Peristalsis is  diminished. Extremities have no cyanosis or edema full range of motion present. Neurologic: Mental status is normal, cranial nerves are intact, there are no focal motor or sensory deficits. Psychiatric: No abnormalities of mood or affect. ED Course  Procedures (including critical care time)    Results for orders placed during the hospital encounter of 02/20/11  CBC      Component Value Range   WBC 5.9  4.0 - 10.5 (K/uL)   RBC 3.75 (*) 3.87 - 5.11 (MIL/uL)   Hemoglobin 12.0  12.0 - 15.0 (g/dL)   HCT 30.8 (*) 65.7 - 46.0 (%)   MCV 94.7  78.0 - 100.0 (fL)   MCH 32.0  26.0 - 34.0 (pg)   MCHC 33.8  30.0 - 36.0 (g/dL)   RDW 84.6  96.2 - 95.2 (%)   Platelets 277  150 - 400 (K/uL)  DIFFERENTIAL      Component Value Range   Neutrophils Relative 47  43 - 77 (%)   Neutro Abs 2.8  1.7 - 7.7 (K/uL)   Lymphocytes Relative 41  12 - 46 (%)   Lymphs Abs 2.4  0.7 - 4.0 (K/uL)   Monocytes Relative 8  3 - 12 (%)   Monocytes Absolute 0.5  0.1 - 1.0 (K/uL)   Eosinophils Relative 4  0 - 5 (%)   Eosinophils Absolute 0.2  0.0 - 0.7 (K/uL)  Basophils Relative 1  0 - 1 (%)   Basophils Absolute 0.0  0.0 - 0.1 (K/uL)  COMPREHENSIVE METABOLIC PANEL      Component Value Range   Sodium 138  135 - 145 (mEq/L)   Potassium 3.8  3.5 - 5.1 (mEq/L)   Chloride 103  96 - 112 (mEq/L)   CO2 28  19 - 32 (mEq/L)   Glucose, Bld 93  70 - 99 (mg/dL)   BUN 12  6 - 23 (mg/dL)   Creatinine, Ser 1.61  0.50 - 1.10 (mg/dL)   Calcium 8.4  8.4 - 09.6 (mg/dL)   Total Protein 6.9  6.0 - 8.3 (g/dL)   Albumin 3.0 (*) 3.5 - 5.2 (g/dL)   AST 13  0 - 37 (U/L)   ALT 11  0 - 35 (U/L)   Alkaline Phosphatase 55  39 - 117 (U/L)   Total Bilirubin 0.2 (*) 0.3 - 1.2 (mg/dL)   GFR calc non Af Amer >90  >90 (mL/min)   GFR calc Af Amer >90  >90 (mL/min)  LIPASE, BLOOD      Component Value Range   Lipase 34  11 - 59 (U/L)  URINALYSIS, ROUTINE W REFLEX MICROSCOPIC      Component Value Range   Color, Urine YELLOW  YELLOW    Appearance  CLOUDY (*) CLEAR    Specific Gravity, Urine 1.025  1.005 - 1.030    pH 6.0  5.0 - 8.0    Glucose, UA NEGATIVE  NEGATIVE (mg/dL)   Hgb urine dipstick LARGE (*) NEGATIVE    Bilirubin Urine NEGATIVE  NEGATIVE    Ketones, ur NEGATIVE  NEGATIVE (mg/dL)   Protein, ur NEGATIVE  NEGATIVE (mg/dL)   Urobilinogen, UA 1.0  0.0 - 1.0 (mg/dL)   Nitrite NEGATIVE  NEGATIVE    Leukocytes, UA LARGE (*) NEGATIVE   POCT PREGNANCY, URINE      Component Value Range   Preg Test, Ur NEGATIVE    URINE MICROSCOPIC-ADD ON      Component Value Range   Squamous Epithelial / LPF MANY (*) RARE    WBC, UA 11-20  <3 (WBC/hpf)   RBC / HPF 3-6  <3 (RBC/hpf)   Bacteria, UA MANY (*) RARE    US Abdomen Limited  02/19/2011  *RADIOLOGY REPORT*  Clinical Data:  Right upper quadrant pain for 2 days  LIMITED ABDOMINAL ULTRASOUND - RIGHT UPPER QUADRANT  Comparison:  Ultrasound of the abdomen of 12/14/2009  Findings:  Gallbladder:  There are several gallstones present with the largest measuring 2.0 cm.  The gallbladder wall is somewhat prominent and there is pain over gallbladder, findings suspicious for acute cholecystitis.  Common bile duct:  The common bile duct is normal measuring 3.0 mm in diameter.  Liver:  The liver has a normal echogenic pattern.  No ductal dilatation is seen.  IMPRESSION:   Several gallstones with somewhat thickened gallbladder wall and pain over the gallbladder with compression, findings which are suspicious for acute cholecystitis.                   Original Report Authenticated By: Juline Patch, M.D.      No diagnosis found.  She was given IV Dilaudid and Zofran with good relief of pain. Consult has been obtained with Dr. Luisa Hart who will admit the patient and will see her in the ED.  MDM  Acute cholecystitis        Dione Booze, MD 02/20/11 [phone]

## 2011-02-20 NOTE — ED Notes (Signed)
Admitting MD at bedside. Consent for OR obtained

## 2011-02-20 NOTE — Progress Notes (Signed)
20 ga. IV removed from right hand d/t pt reports of soreness. Cath intact upon removal

## 2011-02-20 NOTE — ED Notes (Addendum)
Report received, assumed care.  

## 2011-02-20 NOTE — Transfer of Care (Signed)
Immediate Anesthesia Transfer of Care Note  Patient: [name]  Procedure(s) Performed:  LAPAROSCOPIC CHOLECYSTECTOMY WITH INTRAOPERATIVE CHOLANGIOGRAM  Patient Location: PACU  Anesthesia Type: General  Level of Consciousness: awake, alert , oriented and sedated  Airway & Oxygen Therapy: Patient Spontanous Breathing and Patient connected to nasal cannula oxygen  Post-op Assessment: Report given to PACU RN, Post -op Vital signs reviewed and stable and Patient moving all extremities X 4  Post vital signs: Reviewed and stable  Complications: No apparent anesthesia complications

## 2011-02-20 NOTE — Anesthesia Preprocedure Evaluation (Signed)
Anesthesia Evaluation  Patient identified by MRN, date of birth, ID band Patient awake    Reviewed: Allergy & Precautions, H&P , NPO status , Patient's Chart, lab work & pertinent test results  Airway Mallampati: II  Neck ROM: full    Dental   Pulmonary          Cardiovascular     Neuro/Psych    GI/Hepatic   Endo/Other    Renal/GU      Musculoskeletal   Abdominal   Peds  Hematology   Anesthesia Other Findings obesity  Reproductive/Obstetrics                           Anesthesia Physical Anesthesia Plan  ASA: II  Anesthesia Plan: General   Post-op Pain Management:    Induction: Intravenous  Airway Management Planned: Oral ETT  Additional Equipment:   Intra-op Plan:   Post-operative Plan:   Informed Consent: I have reviewed the patients History and Physical, chart, labs and discussed the procedure including the risks, benefits and alternatives for the proposed anesthesia with the patient or authorized representative who has indicated his/her understanding and acceptance.     Plan Discussed with: CRNA and Surgeon  Anesthesia Plan Comments:         Anesthesia Quick Evaluation

## 2011-02-20 NOTE — ED Notes (Signed)
Was seen at [facility] Tuesday for the same, was informed that it was r/t gallstones and to return if worse, taking meds prescribed, sx worse/not better, denies other sx, just pain.

## 2011-02-20 NOTE — Brief Op Note (Signed)
02/20/2011  11:41 AM  PATIENT:  [name], 40 y.o., female, MRN: [id number]  PREOP DIAGNOSIS:  gallbladder disease  POSTOP DIAGNOSIS:   Cholecystitis, cholelithiasis.  PROCEDURE:   Procedure(s): LAPAROSCOPIC CHOLECYSTECTOMY WITH INTRAOPERATIVE CHOLANGIOGRAM.  SURGEON:   Ovidio Kin, M.D.  ASSISTANT:   Derrell Lolling  ANESTHESIA:   general  EBL:  min  ml  BLOOD ADMINISTERED: none  DRAINS: none   LOCAL MEDICATIONS USED:   25 cc 1/4% marcaine  SPECIMEN:   Gall bladder  COUNTS CORRECT:  YES  INDICATIONS FOR PROCEDURE:  [name] is a 40 y.o. (DOB: 11/16/1970) [name] female whose primary care physician is No primary provider on file. and comes for cholecystectomy.   The indications and risks of the surgery were explained to the patient.  The risks include, but are not limited to, infection, bleeding, and nerve injury.  Note dictated to:   #[id number]  02/20/2011  [facility]

## 2011-02-20 NOTE — Anesthesia Postprocedure Evaluation (Signed)
  Anesthesia Post-op Note  Patient: [name]  Procedure(s) Performed:  LAPAROSCOPIC CHOLECYSTECTOMY WITH INTRAOPERATIVE CHOLANGIOGRAM  Patient Location: PACU  Anesthesia Type: General  Level of Consciousness: awake, alert , oriented and patient cooperative  Airway and Oxygen Therapy: Patient Spontanous Breathing and Patient connected to nasal cannula oxygen  Post-op Pain: mild  Post-op Assessment: Post-op Vital signs reviewed, Patient's Cardiovascular Status Stable, Respiratory Function Stable, RESPIRATORY FUNCTION UNSTABLE, No signs of Nausea or vomiting and Pain level controlled  Post-op Vital Signs: stable  Complications: No apparent anesthesia complications

## 2011-02-20 NOTE — ED Notes (Signed)
Patient is resting comfortably. 

## 2011-02-20 NOTE — ED Notes (Signed)
Report received, assumed care.  

## 2011-02-20 NOTE — ED Notes (Signed)
Patient denies pain and is resting comfortably.  

## 2011-02-21 LAB — COMPREHENSIVE METABOLIC PANEL
ALT: 25 U/L (ref 0–35)
Albumin: 2.7 g/dL — ABNORMAL LOW (ref 3.5–5.2)
Alkaline Phosphatase: 55 U/L (ref 39–117)
Potassium: 3.3 mEq/L — ABNORMAL LOW (ref 3.5–5.1)
Sodium: 137 mEq/L (ref 135–145)
Total Protein: 6.3 g/dL (ref 6.0–8.3)

## 2011-02-21 LAB — CBC
HCT: 35.4 % — ABNORMAL LOW (ref 36.0–46.0)
MCHC: 33.3 g/dL (ref 30.0–36.0)
MCV: 96.5 fL (ref 78.0–100.0)
RDW: 11.8 % (ref 11.5–15.5)

## 2011-02-21 LAB — URINE CULTURE: Culture  Setup Time: 201211281108

## 2011-02-21 MED ORDER — CIPROFLOXACIN HCL 500 MG PO TABS
500.0000 mg | ORAL_TABLET | Freq: Two times a day (BID) | ORAL | Status: DC
  Filled 2011-02-21 (×3): qty 1

## 2011-02-21 MED ORDER — CIPROFLOXACIN HCL 500 MG PO TABS: 500.0000 mg | ORAL_TABLET | Freq: Two times a day (BID) | ORAL | Status: AC

## 2011-02-21 NOTE — Progress Notes (Signed)
1 Day Post-Op  Subjective: Sore, but no other complaints. NO nausea, voiding some.  Objective: Vital signs in last 24 hours: Temp:  [97.4 F (36.3 C)-98.8 F (37.1 C)] 98.6 F (37 C) (11/29 0500) Pulse Rate:  [59-85] 83  (11/29 0500) Resp:  [16-30] 18  (11/29 0500) BP: (102-137)/(57-81) 111/67 mmHg (11/29 0500) SpO2:  [96 %-100 %] 100 % (11/29 0500)    Intake/Output from previous day: 11/28 0701 - 11/29 0700 In: 2368.7 [I.V.:2368.7] Out: 12 [Urine:2; Blood:10] Intake/Output this shift:    General appearance: cooperative, no distress and still tired, and sleeping. GI: soft, non-tender; bowel sounds normal; no masses,  no organomegaly and incisions look good.  Lab Results:   [facility] 02/21/11 0526 02/20/11 0535  WBC 4.6 5.9  HGB 11.8* 12.0  HCT 35.4* 35.5*  PLT 271 277    BMET  Basename 02/21/11 0526 02/20/11 0535  NA 137 138  K 3.3* 3.8  CL 104 103  CO2 25 28  GLUCOSE 99 93  BUN 6 12  CREATININE 0.59 0.69  CALCIUM 7.5* 8.4   PT/INR No results found for this basename: LABPROT:2,INR:2 in the last 72 hours   Studies/Results: Dg Cholangiogram Operative  02/20/2011  *RADIOLOGY REPORT*  Clinical Data:   Cholelithiasis  INTRAOPERATIVE CHOLANGIOGRAM  Technique:  Cholangiographic images from the C-arm fluoroscopic device were submitted for interpretation post-operatively.  Please see the procedural report for the amount of contrast and the fluoroscopy time utilized.  Comparison:  None  Findings:  No persistent filling defects in the common duct. Intrahepatic ducts are incompletely visualized, appearing decompressed centrally. Contrast passes into the duodenum.  IMPRESSION  Negative for retained common duct stone.  Original Report Authenticated By: Osa Craver, M.D.   US Abdomen Limited  02/19/2011  *RADIOLOGY REPORT*  Clinical Data:  Right upper quadrant pain for 2 days  LIMITED ABDOMINAL ULTRASOUND - RIGHT UPPER QUADRANT  Comparison:  Ultrasound of the  abdomen of 12/14/2009  Findings:  Gallbladder:  There are several gallstones present with the largest measuring 2.0 cm.  The gallbladder wall is somewhat prominent and there is pain over gallbladder, findings suspicious for acute cholecystitis.  Common bile duct:  The common bile duct is normal measuring 3.0 mm in diameter.  Liver:  The liver has a normal echogenic pattern.  No ductal dilatation is seen.  IMPRESSION:   Several gallstones with somewhat thickened gallbladder wall and pain over the gallbladder with compression, findings which are suspicious for acute cholecystitis.                   Original Report Authenticated By: Juline Patch, M.D.    Anti-infectives: Anti-infectives     Start     Dose/Rate Route Frequency Ordered Stop   02/20/11 1045   ciprofloxacin (CIPRO) IVPB 400 mg  Status:  Discontinued        400 mg 200 mL/hr over 60 Minutes Intravenous Every 12 hours 02/20/11 1043 02/20/11 1551         Current Facility-Administered Medications  Medication Dose Route Frequency Provider Last Rate Last Dose  . 0.45 % NaCl with KCl 20 mEq / L infusion   Intravenous Continuous Kandis Cocking, MD 100 mL/hr at 02/20/11 1645    . heparin injection 5,000 Units  5,000 Units Subcutaneous Q8H Kandis Cocking, MD   5,000 Units at 02/21/11 0600  . HYDROcodone-acetaminophen (NORCO) 5-325 MG per tablet 1-2 tablet  1-2 tablet Oral Q4H PRN Kandis Cocking, MD  2 tablet at 02/21/11 0630  . influenza  inactive virus vaccine (FLUZONE/FLUARIX) injection 0.5 mL  0.5 mL Intramuscular Tomorrow-1000 Kandis Cocking, MD      . morphine 2 MG/ML injection 1-3 mg  1-3 mg Intravenous Q2H PRN Kandis Cocking, MD   2 mg at 02/20/11 2034  . ondansetron (ZOFRAN) tablet 4 mg  4 mg Oral Q6H PRN Kandis Cocking, MD       Or  . ondansetron [facility]) injection 4 mg  4 mg Intravenous Q6H PRN Kandis Cocking, MD      . DISCONTD: 0.9 %  sodium chloride infusion  999 mL Intravenous Continuous Dione Booze, MD 100 mL/hr at 02/20/11  0653 1,000 mL at 02/20/11 0653  . DISCONTD: bupivacaine (MARCAINE) 0.25 % injection    PRN Kandis Cocking, MD   20 mL at 02/20/11 1349  . DISCONTD: ciprofloxacin (CIPRO) IVPB 400 mg  400 mg Intravenous Q12H Sherrie George, PA   400 mg at 02/20/11 1051  . DISCONTD: dextrose 5 % and 0.45 % NaCl with KCl 20 mEq/L infusion   Intravenous Continuous Sherrie George, PA      . DISCONTD: HYDROmorphone (DILAUDID) injection 0.25-0.5 mg  0.25-0.5 mg Intravenous Q5 min PRN Raiford Simmonds, MD   0.5 mg at 02/20/11 1445  . DISCONTD: HYDROmorphone (DILAUDID) injection 0.25-0.5 mg  0.25-0.5 mg Intravenous Q5 min PRN Bedelia Person, MD      . DISCONTD: HYDROmorphone (DILAUDID) injection 0.5-1 mg  0.5-1 mg Intravenous Q1H PRN Sherrie George, PA   1 mg at 02/20/11 1049  . DISCONTD: iohexol (OMNIPAQUE) 300 MG/ML injection    PRN Kandis Cocking, MD   10 mL at 02/20/11 1330  . DISCONTD: lactated ringers infusion   Intravenous Continuous Raiford Simmonds, MD      . DISCONTD: ondansetron (ZOFRAN) injection 4 mg  4 mg Intravenous Q6H PRN Sherrie George, PA   4 mg at 02/20/11 1050  . DISCONTD: ondansetron (ZOFRAN) injection 4 mg  4 mg Intravenous Q6H PRN Raiford Simmonds, MD      . DISCONTD: promethazine (PHENERGAN) injection 6.25-12.5 mg  6.25-12.5 mg Intravenous Q15 min PRN Bedelia Person, MD      . DISCONTD: sodium chloride irrigation 0.9 %    PRN Kandis Cocking, MD   1,000 mL at 02/20/11 1340   Facility-Administered Medications Ordered in Other Encounters  Medication Dose Route Frequency Provider Last Rate Last Dose  . DISCONTD: fentaNYL (SUBLIMAZE) injection    PRN Marybeth Banks   50 mcg at 02/20/11 1300  . DISCONTD: glycopyrrolate (ROBINUL) injection    PRN Marybeth Banks   0.4 mg at 02/20/11 1353  . DISCONTD: lactated ringers infusion    Continuous PRN Iona Hansen      . DISCONTD: midazolam (VERSED) 5 MG/5ML injection    PRN Marybeth Banks   2 mg at 02/20/11 1230  . DISCONTD: neostigmine (PROSTIGMINE) injection    Intravenous PRN Marybeth Banks   3.5 mg at 02/20/11 1352  . DISCONTD: ondansetron (ZOFRAN) injection    PRN Marybeth Banks   4 mg at 02/20/11 1345  . DISCONTD: propofol (DIPRIVAN) 10 MG/ML infusion    PRN Marybeth Banks   50 mg at 02/20/11 1304  . DISCONTD: rocuronium (ZEMURON) injection    PRN Marybeth Banks   10 mg at 02/20/11 1300    Assessment/Plan Cholecystitis, cholelithiasis, s/p Lap. Cholecystectomy POD1 Possible UTI Plan:  Mobilize, advance diet, continue cipro and have her check at  office for culture results.  Home later today if she does well  LOS: 1 day    [name] 02/21/2011

## 2011-02-21 NOTE — Discharge Summary (Signed)
Physician Discharge Summary  Patient ID: [name] MRN: [id number] DOB/AGE: 06-04-1970 40 y.o.  Admit date: 02/20/2011 Discharge date: 02/21/2011  Admission Diagnoses:1. Cholelithiasis and cholecystitis 2. Probable UTI Discharge Diagnoses: Same Active Problems:  Cholecystitis, acute with cholelithiasis  Obesity (BMI 30-39.9)  UTI (lower urinary tract infection)   PROCEDURES: Laparoscopic cholecystectomy and intraoperative cholangiogram 02/20/2011 Dr. Alfredia Ferguson Course: Patient is a 40 year old Hispanic female Hispanic female presents with 3 days of abdominal pain. She's had this problem before. She's been seen for possible cholecystectomy but was unable to afford it. Her current complaints are similar to those she's had in the past. Workup in the emergency room shows several gallstones measuring up to 2 cm the wall was prominent there is pain over the gallbladder suspicious for acute cholecystitis. Patient was admitted and evaluated by Dr. Ovidio Kin who took her to the operating room later that day. She underwent laparoscopic cholecystectomy. She she continues to do well on the first postoperative morning. We plan to mobilize her advance her diet as she does well discharge later today. Urine culture is still pending. She was placed on IV Cipro admission we'll continue this for 3 days at home. She will have instructions to call our office for followup of her urine culture and returned to the [facility] clinic in 2 weeks for followup. She has instructions for her urinary tract infection and postoperative laparoscopic cholecystectomy. Condition on discharge: Improving. Will [name] physician assistant for Dr. Ovidio Kin          Disposition:    Current Discharge Medication List    START taking these medications   Details  acetaminophen (TYLENOL) 500 MG tablet Take 2 tablets (1,000 mg total) by mouth every 6 (six) hours as needed for pain or fever. Qty: 30 tablet, Refills: 0      ciprofloxacin (CIPRO) 500 MG tablet Take 1 tablet (500 mg total) by mouth 2 (two) times daily. Qty: 6 tablet, Refills: 0    HYDROcodone-acetaminophen (NORCO) 5-325 MG per tablet Take 1-2 tablets by mouth every 4 (four) hours as needed. Qty: 40 tablet, Refills: 0      CONTINUE these medications which have NOT CHANGED   Details  meloxicam (MOBIC) 15 MG tablet Take 15 mg by mouth daily.      ondansetron (ZOFRAN-ODT) 8 MG disintegrating tablet Take 8 mg by mouth every 8 (eight) hours as needed. For nausea        Follow-up Information    Make an appointment with [facility], MD. (Ask for D[facility] clinic)    Contact information:   [facility] [address], [address] [place] [address] [phone]       Follow up with Kandis Cocking, MD. (Call Friday and ask office to check on urine culture.  Tell them you have 3 days of cipro ordered)    Contact information:   [facility] [address], [address] [place] [address] [phone]          Signed: Sherrie George 02/21/2011, 7:51 AM

## 2011-02-21 NOTE — Progress Notes (Signed)
Utilization review completed. Suits, Teri Diane11/29/2012  

## 2011-02-22 ENCOUNTER — Telehealth (INDEPENDENT_AMBULATORY_CARE_PROVIDER_SITE_OTHER): Payer: Self-pay | Admitting: General Surgery

## 2011-02-22 ENCOUNTER — Encounter (HOSPITAL_COMMUNITY): Payer: Self-pay | Admitting: Surgery

## 2011-02-22 ENCOUNTER — Telehealth (INDEPENDENT_AMBULATORY_CARE_PROVIDER_SITE_OTHER): Payer: Self-pay

## 2011-02-22 NOTE — Telephone Encounter (Signed)
Pt needs po appt for po gb. Please call pt at [phone] to set up appt.

## 2011-02-22 NOTE — Telephone Encounter (Signed)
Please call pt back for a appt to come back in to see Dr Jaclyn Prime. They were on [employer]. Please call back on [phone]

## 2011-02-22 NOTE — Telephone Encounter (Signed)
Patient is to follow up in D[facility] clinic. Message given to [employer]

## 2011-02-26 NOTE — Telephone Encounter (Signed)
PT HAS POST-OP APPOINTMENT WITH [facility] ON 03-08-11. GY

## 2011-03-05 ENCOUNTER — Ambulatory Visit (INDEPENDENT_AMBULATORY_CARE_PROVIDER_SITE_OTHER): Payer: BC Managed Care – PPO | Admitting: General Surgery

## 2011-03-05 ENCOUNTER — Encounter (INDEPENDENT_AMBULATORY_CARE_PROVIDER_SITE_OTHER): Payer: Self-pay

## 2011-03-05 VITALS — BP 108/80 | HR 64 | Temp 97.4°F | Resp 20 | Ht 61.0 in | Wt 196.0 lb

## 2011-03-05 DIAGNOSIS — Z9049 Acquired absence of other specified parts of digestive tract: Secondary | ICD-10-CM

## 2011-03-05 DIAGNOSIS — Z9889 Other specified postprocedural states: Secondary | ICD-10-CM

## 2011-03-05 DIAGNOSIS — K8000 Calculus of gallbladder with acute cholecystitis without obstruction: Secondary | ICD-10-CM

## 2011-03-05 NOTE — Progress Notes (Signed)
Jamyia E Brownley April 21, 1970 [id number] 03/05/2011   Claressa ALBERT DEVAUL is a 40 y.o. female who had a laparoscopic cholecystectomy with intraoperative cholangiogram.  The pathology report confirmed chronic cholecystitis with cholelithiasis.  The patient reports that they are feeling well with normal bowel movements and good appetite.  The pre-operative symptoms of abdominal pain, nausea, and vomiting have resolved.    Physical examination - Incisions appear well-healed with no sign of infection or bleeding.   Abdomen - soft, non-tender  Impression:  s/p laparoscopic cholecystectomy  Plan:  She may resume a regular diet and full activity.  She may follow-up on a PRN basis.

## 2011-03-05 NOTE — Patient Instructions (Signed)
Follow up as needed Follow up with a primary care doctor for the pain in your arm

## 2011-03-08 ENCOUNTER — Ambulatory Visit (INDEPENDENT_AMBULATORY_CARE_PROVIDER_SITE_OTHER): Payer: Self-pay | Admitting: General Surgery

## 2011-12-02 ENCOUNTER — Encounter (HOSPITAL_COMMUNITY): Payer: Self-pay | Admitting: *Deleted

## 2011-12-02 ENCOUNTER — Emergency Department (HOSPITAL_COMMUNITY)
Admission: EM | Admit: 2011-12-02 | Discharge: 2011-12-02 | Disposition: A | Discharge status: Home / Self Care | Payer: BC Managed Care – PPO | Attending: Emergency Medicine | Admitting: Emergency Medicine

## 2011-12-02 ENCOUNTER — Emergency Department (HOSPITAL_COMMUNITY): Payer: BC Managed Care – PPO

## 2011-12-02 DIAGNOSIS — R109 Unspecified abdominal pain: Secondary | ICD-10-CM | POA: Insufficient documentation

## 2011-12-02 DIAGNOSIS — F17200 Nicotine dependence, unspecified, uncomplicated: Secondary | ICD-10-CM | POA: Insufficient documentation

## 2011-12-02 LAB — CBC WITH DIFFERENTIAL/PLATELET
Basophils Relative: 1 % (ref 0–1)
Eosinophils Absolute: 0.1 10*3/uL (ref 0.0–0.7)
Eosinophils Relative: 2 % (ref 0–5)
HCT: 40.1 % (ref 36.0–46.0)
Hemoglobin: 14.2 g/dL (ref 12.0–15.0)
MCH: 34.1 pg — ABNORMAL HIGH (ref 26.0–34.0)
MCHC: 35.4 g/dL (ref 30.0–36.0)
MCV: 96.4 fL (ref 78.0–100.0)
Monocytes Absolute: 0.4 10*3/uL (ref 0.1–1.0)
Monocytes Relative: 8 % (ref 3–12)
RDW: 11.4 % — ABNORMAL LOW (ref 11.5–15.5)

## 2011-12-02 LAB — URINALYSIS, ROUTINE W REFLEX MICROSCOPIC
Bilirubin Urine: NEGATIVE
Ketones, ur: NEGATIVE mg/dL
Protein, ur: NEGATIVE mg/dL
Specific Gravity, Urine: 1.007 (ref 1.005–1.030)
Urobilinogen, UA: 0.2 mg/dL (ref 0.0–1.0)

## 2011-12-02 LAB — BASIC METABOLIC PANEL
BUN: 7 mg/dL (ref 6–23)
Chloride: 99 mEq/L (ref 96–112)
Creatinine, Ser: 0.56 mg/dL (ref 0.50–1.10)
GFR calc Af Amer: >90 mL/min (ref >90)
Glucose, Bld: 93 mg/dL (ref 70–99)

## 2011-12-02 LAB — URINE MICROSCOPIC-ADD ON

## 2011-12-02 MED ORDER — MORPHINE SULFATE 4 MG/ML IJ SOLN
4.0000 mg | Freq: Once | INTRAMUSCULAR | Status: AC
  Administered 2011-12-02: 4 mg via INTRAVENOUS
  Filled 2011-12-02: qty 1

## 2011-12-02 MED ORDER — IOHEXOL 300 MG/ML  SOLN
20.0000 mL | INTRAMUSCULAR | Status: AC
  Administered 2011-12-02: 20 mL via ORAL

## 2011-12-02 MED ORDER — ONDANSETRON HCL 4 MG/2ML IJ SOLN
4.0000 mg | Freq: Once | INTRAMUSCULAR | Status: AC
  Administered 2011-12-02: 4 mg via INTRAVENOUS
  Filled 2011-12-02: qty 2

## 2011-12-02 MED ORDER — HYDROMORPHONE HCL PF 1 MG/ML IJ SOLN
1.0000 mg | Freq: Once | INTRAMUSCULAR | Status: AC
  Administered 2011-12-02: 1 mg via INTRAVENOUS
  Filled 2011-12-02: qty 1

## 2011-12-02 MED ORDER — IOHEXOL 300 MG/ML  SOLN
100.0000 mL | Freq: Once | INTRAMUSCULAR | Status: AC | PRN
  Administered 2011-12-02: 100 mL via INTRAVENOUS

## 2011-12-02 MED ORDER — SODIUM CHLORIDE 0.9 % IV SOLN
INTRAVENOUS | Status: DC
  Administered 2011-12-02: 08:00:00 via INTRAVENOUS

## 2011-12-02 MED ORDER — TRAMADOL HCL 50 MG PO TABS: 50.0000 mg | ORAL_TABLET | Freq: Four times a day (QID) | ORAL | Status: AC | PRN

## 2011-12-02 MED ORDER — BISACODYL 5 MG PO TBEC: 5.0000 mg | DELAYED_RELEASE_TABLET | Freq: Two times a day (BID) | ORAL | Status: AC

## 2011-12-02 NOTE — ED Notes (Signed)
Pt returned from CT °

## 2011-12-02 NOTE — ED Notes (Signed)
CT made aware pt finished drinking PO contrast approximately 20 minutes ago

## 2011-12-02 NOTE — ED Notes (Signed)
Pt states that she has been having abdominal pain for the past month. Pt states that she has generalized abdominal pain with her stomach becoming bloated at time. Pt denies problems with bowels, pt states frequency with urination but denies pressure, or burning.

## 2011-12-02 NOTE — ED Notes (Signed)
Dr. Caporossi at the bedside. 

## 2011-12-02 NOTE — ED Provider Notes (Signed)
History     CSN: [id number]  Arrival date & time 12/02/11  4098   First MD Initiated Contact with Patient 12/02/11 [phone]      Chief Complaint  Patient presents with  . Abdominal Pain    (Consider location/radiation/quality/duration/timing/severity/associated sxs/prior treatment) Patient is a 41 y.o. female presenting with abdominal pain. The history is provided by the patient.  Abdominal Pain The primary symptoms of the illness include abdominal pain. The primary symptoms of the illness do not include fever, shortness of breath, nausea, vomiting, diarrhea or dysuria.  Symptoms associated with the illness do not include chills, constipation, hematuria or back pain.   41 year old, female, with a history of cholecystectomy, presents to emergency department complaining of left-sided abdominal pain for approximately a month.  She denies nausea, vomiting, diarrhea.  She denies urinary tract symptoms, or hematuria.  She says her pains.  Increases occasionally when she eats.  She denies respiratory symptoms.  She still has her menstrual cycles and states that her last period was normal.  Past Medical History  Diagnosis Date  . No pertinent past medical history     Past Surgical History  Procedure Date  . Tubal ligaion   . Cholecystectomy   . Tubal ligation   . Cholecystectomy 02/20/2011    Procedure: LAPAROSCOPIC CHOLECYSTECTOMY WITH INTRAOPERATIVE CHOLANGIOGRAM;  Surgeon: Kandis Cocking, MD;  Location: [facility] OR;  Service: General;  Laterality: N/A;    History reviewed. No pertinent family history.  History  Substance Use Topics  . Smoking status: Current Everyday Smoker  . Smokeless tobacco: Never Used  . Alcohol Use: No    OB History    Grav Para Term Preterm Abortions TAB SAB Ect Mult Living                  Review of Systems  Constitutional: Negative for fever, chills and unexpected weight change.  Respiratory: Negative for cough and shortness of breath.   Cardiovascular:  Negative for chest pain.  Gastrointestinal: Positive for abdominal pain. Negative for nausea, vomiting, diarrhea and constipation.  Genitourinary: Negative for dysuria, hematuria and menstrual problem.  Musculoskeletal: Negative for back pain.  Neurological: Negative for headaches.  Psychiatric/Behavioral: Negative for confusion.  All other systems reviewed and are negative.    Allergies  Review of patient's allergies indicates no known allergies.  Home Medications   Current Outpatient Rx  Name Route Sig Dispense Refill  . ACETAMINOPHEN PO Oral Take by mouth as needed.      Marland Kitchen HYDROCODONE-ACETAMINOPHEN 5-325 MG PO TABS Oral Take 1 tablet by mouth every 6 (six) hours as needed.      . MELOXICAM 15 MG PO TABS Oral Take 15 mg by mouth daily.        BP 122/73  Pulse 70  Temp 98.4 F (36.9 C) (Oral)  Resp 16  Ht 5\' 2"  (1.575 m)  Wt 200 lb (90.719 kg)  BMI 36.58 kg/m2  SpO2 99%  Physical Exam  Nursing note and vitals reviewed. Constitutional: She is oriented to person, place, and time. She appears well-developed and well-nourished. No distress.       Obese  HENT:  Head: Normocephalic and atraumatic.  Eyes: Conjunctivae are normal.  Neck: Normal range of motion. Neck supple.  Cardiovascular: Normal rate and intact distal pulses.   No murmur heard. Pulmonary/Chest: Effort normal and breath sounds normal.  Abdominal: Soft. Bowel sounds are normal. She exhibits no distension. There is tenderness. There is no rebound and no guarding.  Left upper quadrant tenderness, left lower quadrant tenderness, and suprapubic tenderness, with no peritoneal signs  Musculoskeletal: Normal range of motion. She exhibits no edema.  Neurological: She is alert and oriented to person, place, and time.  Skin: Skin is warm and dry.  Psychiatric: She has a normal mood and affect.    ED Course  Procedures (including critical care time) 41 year old, female, with left-sided abdominal pain for one  month.  No associated symptoms.  We'll establish an IV and treat her with analgesics, antiemetics, and perform laboratory testing, and a CAT scan for further evaluation  Labs Reviewed  BASIC METABOLIC PANEL  CBC WITH DIFFERENTIAL  URINALYSIS, ROUTINE W REFLEX MICROSCOPIC   No results found.   No diagnosis found.  12:06 PM Pt sleeping comfortably.  MDM  Abdominal pain - poss constipation No acute abdomen. No renal dz        Cheri Guppy, MD 12/02/11 [phone]

## 2011-12-02 NOTE — ED Notes (Signed)
PO contrast given by Radiology

## 2011-12-14 ENCOUNTER — Emergency Department (HOSPITAL_COMMUNITY): Payer: BC Managed Care – PPO

## 2011-12-14 ENCOUNTER — Emergency Department (HOSPITAL_COMMUNITY)
Admission: EM | Admit: 2011-12-14 | Discharge: 2011-12-14 | Disposition: A | Discharge status: Home / Self Care | Payer: BC Managed Care – PPO | Attending: Emergency Medicine | Admitting: Emergency Medicine

## 2011-12-14 ENCOUNTER — Encounter (HOSPITAL_COMMUNITY): Payer: Self-pay | Admitting: *Deleted

## 2011-12-14 DIAGNOSIS — R0602 Shortness of breath: Secondary | ICD-10-CM | POA: Insufficient documentation

## 2011-12-14 DIAGNOSIS — R1012 Left upper quadrant pain: Secondary | ICD-10-CM | POA: Insufficient documentation

## 2011-12-14 DIAGNOSIS — Z9089 Acquired absence of other organs: Secondary | ICD-10-CM | POA: Insufficient documentation

## 2011-12-14 LAB — POCT I-STAT TROPONIN I: Troponin i, poc: 0 ng/mL (ref 0.00–0.08)

## 2011-12-14 LAB — URINALYSIS, ROUTINE W REFLEX MICROSCOPIC
Nitrite: NEGATIVE
Specific Gravity, Urine: 1.006 (ref 1.005–1.030)
Urobilinogen, UA: 0.2 mg/dL (ref 0.0–1.0)

## 2011-12-14 LAB — CBC
HCT: 37.2 % (ref 36.0–46.0)
Hemoglobin: 12.8 g/dL (ref 12.0–15.0)
MCH: 33.3 pg (ref 26.0–34.0)
RBC: 3.84 MIL/uL — ABNORMAL LOW (ref 3.87–5.11)

## 2011-12-14 LAB — LIPASE, BLOOD: Lipase: 24 U/L (ref 11–59)

## 2011-12-14 LAB — BASIC METABOLIC PANEL
BUN: 11 mg/dL (ref 6–23)
CO2: 22 mEq/L (ref 19–32)
Calcium: 8.4 mg/dL (ref 8.4–10.5)
Glucose, Bld: 142 mg/dL — ABNORMAL HIGH (ref 70–99)
Sodium: 133 mEq/L — ABNORMAL LOW (ref 135–145)

## 2011-12-14 MED ORDER — HYDROMORPHONE HCL PF 1 MG/ML IJ SOLN
0.5000 mg | Freq: Once | INTRAMUSCULAR | Status: AC
  Administered 2011-12-14: 0.5 mg via INTRAVENOUS
  Filled 2011-12-14: qty 1

## 2011-12-14 MED ORDER — OXYCODONE-ACETAMINOPHEN 5-325 MG PO TABS: 1.0000 | ORAL_TABLET | Freq: Four times a day (QID) | ORAL | Status: DC | PRN

## 2011-12-14 NOTE — ED Notes (Signed)
Patient reports she has had chest pain and back pain for months.  She was seen here for same 2 weeks ago with no dx.  Patient continues to have back and chest pain.  She has noted sob at rest.

## 2011-12-14 NOTE — ED Provider Notes (Signed)
History     CSN: [id number]  Arrival date & time 12/14/11  1207   None     Chief Complaint  Patient presents with  . Back Pain  . Chest Pain  . Shortness of Breath    (Consider location/radiation/quality/duration/timing/severity/associated sxs/prior treatment) Patient is a 41 y.o. female presenting with abdominal pain. The history is provided by the patient.  Abdominal Pain The primary symptoms of the illness include abdominal pain. The primary symptoms of the illness do not include fever, fatigue, shortness of breath, nausea, vomiting, diarrhea or dysuria. Episode onset: 1 mos ago. The onset of the illness was gradual.  Onset: 1 mos ago. The pain came on gradually. The abdominal pain has been gradually worsening since its onset. The abdominal pain is located in the LUQ. The abdominal pain does not radiate. The severity of the abdominal pain is 8/10. Relieved by: mildly by pain meds. Exacerbated by: nothing.  Associated with: unknown. The patient states that she believes she is currently not pregnant. The patient has not had a change in bowel habit. Symptoms associated with the illness do not include hematuria or back pain.    Past Medical History  Diagnosis Date  . No pertinent past medical history     Past Surgical History  Procedure Date  . Tubal ligaion   . Cholecystectomy   . Tubal ligation   . Cholecystectomy 02/20/2011    Procedure: LAPAROSCOPIC CHOLECYSTECTOMY WITH INTRAOPERATIVE CHOLANGIOGRAM;  Surgeon: Kandis Cocking, MD;  Location: [facility] OR;  Service: General;  Laterality: N/A;    No family history on file.  History  Substance Use Topics  . Smoking status: Current Every Day Smoker  . Smokeless tobacco: Never Used  . Alcohol Use: No    OB History    Grav Para Term Preterm Abortions TAB SAB Ect Mult Living                  Review of Systems  Constitutional: Negative for fever and fatigue.  HENT: Negative for congestion, drooling and neck pain.   Eyes:  Negative for pain.  Respiratory: Negative for cough and shortness of breath.   Cardiovascular: Negative for chest pain.  Gastrointestinal: Positive for abdominal pain. Negative for nausea, vomiting and diarrhea.  Genitourinary: Negative for dysuria and hematuria.  Musculoskeletal: Negative for back pain and gait problem.  Skin: Negative for color change.  Neurological: Negative for dizziness and headaches.  Hematological: Negative for adenopathy.  Psychiatric/Behavioral: Negative for behavioral problems.  All other systems reviewed and are negative.    Allergies  Review of patient's allergies indicates no known allergies.  Home Medications   Current Outpatient Rx  Name Route Sig Dispense Refill  . BISACODYL 5 MG PO TBEC Oral Take 1 tablet (5 mg total) by mouth 2 (two) times daily. 14 tablet 0  . NAPROXEN SODIUM 220 MG PO TABS Oral Take 220 mg by mouth 2 (two) times daily as needed. For pain    . TRAMADOL HCL 50 MG PO TABS Oral Take 50 mg by mouth every 6 (six) hours as needed. For pain    . OXYCODONE-ACETAMINOPHEN 5-325 MG PO TABS Oral Take 1 tablet by mouth every 6 (six) hours as needed for pain. 10 tablet 0    BP 104/62  Pulse 78  Temp 97.7 F (36.5 C) (Oral)  Resp 19  Ht 5\' 2"  (1.575 m)  Wt 200 lb (90.719 kg)  BMI 36.58 kg/m2  SpO2 98%  LMP 11/25/2011  Physical Exam  Nursing note and vitals reviewed. Constitutional: She is oriented to person, place, and time. She appears well-developed and well-nourished.  HENT:  Head: Normocephalic.  Mouth/Throat: No oropharyngeal exudate.  Eyes: Conjunctivae normal and EOM are normal. Pupils are equal, round, and reactive to light.  Neck: Normal range of motion. Neck supple.  Cardiovascular: Normal rate, regular rhythm, normal heart sounds and intact distal pulses.  Exam reveals no gallop and no friction rub.   No murmur heard. Pulmonary/Chest: Effort normal and breath sounds normal. No respiratory distress. She has no wheezes.    Abdominal: Soft. Bowel sounds are normal. There is tenderness (mild ttp of LUQ, focal ttp of left lower anterior ribs below left breast). There is no rebound and no guarding.  Musculoskeletal: Normal range of motion. She exhibits no edema and no tenderness.  Neurological: She is alert and oriented to person, place, and time.  Skin: Skin is warm and dry.  Psychiatric: She has a normal mood and affect. Her behavior is normal.    ED Course  Procedures (including critical care time)  Labs Reviewed  CBC - Abnormal; Notable for the following:    RBC 3.84 (*)     RDW 11.4 (*)     All other components within normal limits  BASIC METABOLIC PANEL - Abnormal; Notable for the following:    Sodium 133 (*)     Potassium 3.3 (*)     Glucose, Bld 142 (*)     All other components within normal limits  URINALYSIS, ROUTINE W REFLEX MICROSCOPIC - Abnormal; Notable for the following:    Hgb urine dipstick MODERATE (*)     All other components within normal limits  URINE MICROSCOPIC-ADD ON - Abnormal; Notable for the following:    Squamous Epithelial / LPF FEW (*)     All other components within normal limits  PRO B NATRIURETIC PEPTIDE  POCT I-STAT TROPONIN I  LIPASE, BLOOD  POCT PREGNANCY, URINE  H. PYLORI ANTIBODY, IGG   Dg Chest 2 View  12/14/2011  *RADIOLOGY REPORT*  Clinical Data: Chest pain and shortness of breath.  CHEST - 2 VIEW  Comparison: Chest x-ray 12/14/2009.  Findings: Lung volumes are normal.  No consolidative airspace disease.  No pleural effusions.  No pneumothorax.  No pulmonary nodule or mass noted.  Pulmonary vasculature and the cardiomediastinal silhouette are within normal limits.  Surgical clips project over the right upper quadrant of the abdomen, compatible with prior cholecystectomy.  IMPRESSION: 1. No radiographic evidence of acute cardiopulmonary disease.   Original Report Authenticated By: Florencia Reasons, M.D.      1. LUQ abdominal pain      Date: 12/14/2011   Rate: 76  Rhythm: normal sinus rhythm  QRS Axis: normal  Intervals: normal  ST/T Wave abnormalities: early repolarization  Conduction Disutrbances:none  Narrative Interpretation: No ST or T wave changes cw ischemia  Old EKG Reviewed: none available    MDM  11:38 AM 41 y.o. female pw LUQ abd pain x 1 mos. Pt seen here on 12/02/11, had non-contrib labs, ct abd pelvis. She pw continued LUQ pain. She denies N/V/D/F, she does have mild sob when pain is high. Pt AFVSS here, wells/perc neg. Pain reproducible w/ palpation of ribs directly below left breast. Will get pain control, screening labs, ecg.    11:38 AM: Pt feeling better on exam. Unknown cause of pt's pain, possibly MSK. I have discussed the diagnosis/risks/treatment options with the patient and believe the pt to be eligible for  discharge home to follow-up and establish w/ a pcp to follow her sx. We also discussed returning to the ED immediately if new or worsening sx occur. We discussed the sx which are most concerning (e.g., worsening pain, inability to tolerate po) that necessitate immediate return. Any new prescriptions provided to the patient are listed below.  New Prescriptions   OXYCODONE-ACETAMINOPHEN (PERCOCET) 5-325 MG PER TABLET    Take 1 tablet by mouth every 6 (six) hours as needed for pain.    Clinical Impression 1. LUQ abdominal pain        Purvis Sheffield, MD 12/15/11 1138

## 2011-12-15 NOTE — ED Provider Notes (Signed)
I  reviewed the resident's note and I agree with the findings and plan.  Cheri Guppy, MD 12/15/11 1504

## 2011-12-16 LAB — H. PYLORI ANTIBODY, IGG: H Pylori IgG: 5.85 {ISR} — ABNORMAL HIGH

## 2012-01-27 ENCOUNTER — Emergency Department (HOSPITAL_COMMUNITY): Payer: BC Managed Care – PPO

## 2012-01-27 ENCOUNTER — Emergency Department (HOSPITAL_COMMUNITY)
Admission: EM | Admit: 2012-01-27 | Discharge: 2012-01-27 | Disposition: A | Discharge status: Home / Self Care | Payer: BC Managed Care – PPO | Attending: Emergency Medicine | Admitting: Emergency Medicine

## 2012-01-27 ENCOUNTER — Encounter (HOSPITAL_COMMUNITY): Payer: Self-pay | Admitting: Emergency Medicine

## 2012-01-27 DIAGNOSIS — A048 Other specified bacterial intestinal infections: Secondary | ICD-10-CM

## 2012-01-27 DIAGNOSIS — R1012 Left upper quadrant pain: Secondary | ICD-10-CM | POA: Insufficient documentation

## 2012-01-27 DIAGNOSIS — R0789 Other chest pain: Secondary | ICD-10-CM

## 2012-01-27 DIAGNOSIS — F17200 Nicotine dependence, unspecified, uncomplicated: Secondary | ICD-10-CM | POA: Insufficient documentation

## 2012-01-27 DIAGNOSIS — M549 Dorsalgia, unspecified: Secondary | ICD-10-CM | POA: Insufficient documentation

## 2012-01-27 LAB — URINALYSIS, ROUTINE W REFLEX MICROSCOPIC
Glucose, UA: NEGATIVE mg/dL
Leukocytes, UA: NEGATIVE
Protein, ur: NEGATIVE mg/dL
pH: 6.5 (ref 5.0–8.0)

## 2012-01-27 LAB — HEPATIC FUNCTION PANEL
ALT: 11 U/L (ref 0–35)
AST: 20 U/L (ref 0–37)
Albumin: 3.1 g/dL — ABNORMAL LOW (ref 3.5–5.2)
Alkaline Phosphatase: 65 U/L (ref 39–117)
Total Protein: 7.5 g/dL (ref 6.0–8.3)

## 2012-01-27 LAB — BASIC METABOLIC PANEL
Chloride: 101 mEq/L (ref 96–112)
GFR calc Af Amer: >90 mL/min (ref >90)
Glucose, Bld: 124 mg/dL — ABNORMAL HIGH (ref 70–99)
Potassium: 3.4 mEq/L — ABNORMAL LOW (ref 3.5–5.1)
Sodium: 136 mEq/L (ref 135–145)

## 2012-01-27 LAB — CBC
Hemoglobin: 12.9 g/dL (ref 12.0–15.0)
MCHC: 34.3 g/dL (ref 30.0–36.0)
RBC: 3.9 MIL/uL (ref 3.87–5.11)
WBC: 5 10*3/uL (ref 4.0–10.5)

## 2012-01-27 LAB — URINE MICROSCOPIC-ADD ON

## 2012-01-27 LAB — POCT I-STAT TROPONIN I: Troponin i, poc: 0 ng/mL (ref 0.00–0.08)

## 2012-01-27 MED ORDER — AMOXICILLIN 500 MG PO CAPS: 1000.0000 mg | ORAL_CAPSULE | Freq: Two times a day (BID) | ORAL | Status: AC

## 2012-01-27 MED ORDER — GI COCKTAIL ~~LOC~~
30.0000 mL | Freq: Once | ORAL | Status: AC
  Administered 2012-01-27: 30 mL via ORAL
  Filled 2012-01-27: qty 30

## 2012-01-27 MED ORDER — CLARITHROMYCIN 500 MG PO TABS: 500.0000 mg | ORAL_TABLET | Freq: Two times a day (BID) | ORAL | Status: AC

## 2012-01-27 MED ORDER — SODIUM CHLORIDE 0.9 % IV BOLUS (SEPSIS)
1000.0000 mL | Freq: Once | INTRAVENOUS | Status: AC
  Administered 2012-01-27: 1000 mL via INTRAVENOUS

## 2012-01-27 MED ORDER — HYDROCODONE-ACETAMINOPHEN 5-325 MG PO TABS: 1.0000 | ORAL_TABLET | ORAL | Status: DC | PRN

## 2012-01-27 MED ORDER — HYDROMORPHONE HCL PF 1 MG/ML IJ SOLN
1.0000 mg | Freq: Once | INTRAMUSCULAR | Status: AC
  Administered 2012-01-27: 1 mg via INTRAVENOUS
  Filled 2012-01-27: qty 1

## 2012-01-27 MED ORDER — OMEPRAZOLE 20 MG PO CPDR: 20.0000 mg | DELAYED_RELEASE_CAPSULE | Freq: Two times a day (BID) | ORAL | Status: DC

## 2012-01-27 MED ORDER — ONDANSETRON HCL 4 MG/2ML IJ SOLN
4.0000 mg | Freq: Once | INTRAMUSCULAR | Status: AC
  Administered 2012-01-27: 4 mg via INTRAVENOUS
  Filled 2012-01-27 (×2): qty 2

## 2012-01-27 NOTE — ED Notes (Signed)
The pt has had rt sided chest pain for many months.  She has been seen here x3 for the same.  She reports that she has no diagnosis yet.  Family at the bedside

## 2012-01-27 NOTE — ED Provider Notes (Signed)
History     CSN: [id number]  Arrival date & time 01/27/12  1140   First MD Initiated Contact with Patient 01/27/12 1706      Chief Complaint  Patient presents with  . Chest Pain  . Back Pain    (Consider location/radiation/quality/duration/timing/severity/associated sxs/prior treatment) HPI Chest pain/abdominal pain for past several months.  Pain described as sharp, severe, currently 9.10 in intensity. The location of the patient's problem is generalized including chest, epigastrium, LUQ, R flank.  Onset was months ago with worsening course yesterday since that time.   Modifying factors:  Improved with PO narcotics, worse by nothing.  Associated symptoms: nausea but no emesis. Mild SOB.   Past Medical History  Diagnosis Date  . No pertinent past medical history     Past Surgical History  Procedure Date  . Tubal ligaion   . Cholecystectomy   . Tubal ligation   . Cholecystectomy 02/20/2011    Procedure: LAPAROSCOPIC CHOLECYSTECTOMY WITH INTRAOPERATIVE CHOLANGIOGRAM;  Surgeon: Kandis Cocking, MD;  Location: [facility] OR;  Service: General;  Laterality: N/A;    History reviewed. No pertinent family history.  History  Substance Use Topics  . Smoking status: Current Every Day Smoker  . Smokeless tobacco: Never Used  . Alcohol Use: No    OB History    Grav Para Term Preterm Abortions TAB SAB Ect Mult Living                  Review of Systems Negative for respiratory distress, cough. Negative for vomiting, diarrhea. Endorses some dysuria.  LMP 2 weeks ago.   All other systems reviewed and negative unless noted in HPI.    Allergies  Review of patient's allergies indicates no known allergies.  Home Medications   Current Outpatient Rx  Name  Route  Sig  Dispense  Refill  . NAPROXEN SODIUM 220 MG PO TABS   Oral   Take 220 mg by mouth 2 (two) times daily as needed. For pain         . TRAMADOL HCL 50 MG PO TABS   Oral   Take 50 mg by mouth every 6 (six) hours as  needed. For pain           BP 117/74  Pulse 78  Temp 98 F (36.7 C) (Oral)  Resp 25  SpO2 100%  LMP 01/08/2012  Physical Exam Nursing note and vitals reviewed.  Constitutional: Pt is alert and appears stated age. Oropharynx: Airway open without erythema or exudate. Respiratory: No respiratory distress. Equal breathing bilaterally. CV: Extremities warm and well perfused. Neuro: No motor nor sensory deficit. Head: Normocephalic and atraumatic. Eyes: No conjunctivitis, no scleral icterus. Neck: Supple, no mass. Chest: Non-tender. Abdomen: Soft, epigastric and LUQ abdominal pain. No rebound or guarding.  MSK: Extremities are atraumatic without deformity. Skin: No rash, no wounds.   ED Course  Procedures (including critical care time)  Labs Reviewed  BASIC METABOLIC PANEL - Abnormal; Notable for the following:    Potassium 3.4 (*)     Glucose, Bld 124 (*)     Calcium 8.3 (*)     All other components within normal limits  URINALYSIS, ROUTINE W REFLEX MICROSCOPIC - Abnormal; Notable for the following:    Hgb urine dipstick LARGE (*)     All other components within normal limits  HEPATIC FUNCTION PANEL - Abnormal; Notable for the following:    Albumin 3.1 (*)     Total Bilirubin 0.1 (*)  All other components within normal limits  URINE MICROSCOPIC-ADD ON - Abnormal; Notable for the following:    Squamous Epithelial / LPF FEW (*)     All other components within normal limits  CBC  POCT I-STAT TROPONIN I  LIPASE, BLOOD  POCT PREGNANCY, URINE   Dg Chest 2 View  01/27/2012  *RADIOLOGY REPORT*  Clinical Data: Shortness of breath, back pain, chest pain.  CHEST - 2 VIEW  Comparison: 12/14/2011  Findings: Heart and mediastinal contours are within normal limits. No focal opacities or effusions.  No acute bony abnormality.  IMPRESSION: No active cardiopulmonary disease.   Original Report Authenticated By: Charlett Nose, M.D.      1. LUQ abdominal pain   2. Atypical chest  pain   3. Positive H. pylori test       MDM  41 y.o. female here with chest pain/abdominal pain.  Pertinent past problems include cholecystecomy, tubal ligation, tobacco use. Pain has been present for months. During last eval had negative work up. H pylori was sent and returned positive. Pt did not follow up with PCP. Doubt ACS. Negative wells/PERC. No evidence for bowel obstruction. Not pancreatitis. Pain improved with IV dilaudid, IV zofran, GI cocktail. Will treat as PUD with triple therapy in setting of positive h pylori. Counseling provided regarding diagnosis, treatment plan, follow up recommendations, and return precautions. Questions answered.  Data reviewed: EKG ordered and interpreted by me: NSR, no axis deviation, no QRS widening and no significant changes from prior EKG.  Lab tests ordered and reviewed by me: troponin low, u preg neg. Lipase low. CBC, CMP unremarkable. UA with no infection.  I independently viewed the following imaging studies and reviewed radiology's interpretation as summarized: CXR with no acute disease.    Medical Decision Making discussed with ED attending Celene Kras, MD          Charm Barges, MD 01/27/12 [phone]

## 2012-01-27 NOTE — ED Provider Notes (Signed)
I saw and evaluated the patient, reviewed the resident's note and I agree with the findings and plan. I reviewed and interpreted the EKG during the patient's evaluation in the ED and agree with the resident's interpretation.   The patient has been having abdominal pain for the last several months. She was recently seen in the emergency department and did have a Helicobacter pylori test sent. This result was elevated however the patient has not followed up with anyone. We will begin treatment for that and have her followup with her primary doctor possibly GI Dr.  Celene Kras, MD 01/27/12 1950

## 2012-01-27 NOTE — ED Notes (Signed)
The pt reports that her pain is better.  She is still anxious  And apprehensive

## 2012-01-27 NOTE — ED Notes (Signed)
Pt c/o right sided CP through to back x several months that is intermittent and worse today; pt sts SOB

## 2012-01-27 NOTE — ED Notes (Signed)
The pt has had a iv started and med given

## 2012-02-14 ENCOUNTER — Emergency Department (HOSPITAL_COMMUNITY): Payer: BC Managed Care – PPO

## 2012-02-14 ENCOUNTER — Encounter: Payer: Self-pay | Admitting: Gastroenterology

## 2012-02-14 ENCOUNTER — Encounter (HOSPITAL_COMMUNITY): Payer: Self-pay | Admitting: *Deleted

## 2012-02-14 ENCOUNTER — Emergency Department (HOSPITAL_COMMUNITY)
Admission: EM | Admit: 2012-02-14 | Discharge: 2012-02-14 | Disposition: A | Discharge status: Home / Self Care | Payer: BC Managed Care – PPO | Attending: Emergency Medicine | Admitting: Emergency Medicine

## 2012-02-14 DIAGNOSIS — R0602 Shortness of breath: Secondary | ICD-10-CM | POA: Insufficient documentation

## 2012-02-14 DIAGNOSIS — F17200 Nicotine dependence, unspecified, uncomplicated: Secondary | ICD-10-CM | POA: Insufficient documentation

## 2012-02-14 DIAGNOSIS — Z8619 Personal history of other infectious and parasitic diseases: Secondary | ICD-10-CM | POA: Insufficient documentation

## 2012-02-14 DIAGNOSIS — R109 Unspecified abdominal pain: Secondary | ICD-10-CM

## 2012-02-14 DIAGNOSIS — R110 Nausea: Secondary | ICD-10-CM | POA: Insufficient documentation

## 2012-02-14 DIAGNOSIS — R197 Diarrhea, unspecified: Secondary | ICD-10-CM | POA: Insufficient documentation

## 2012-02-14 LAB — URINALYSIS, ROUTINE W REFLEX MICROSCOPIC
Bilirubin Urine: NEGATIVE
Ketones, ur: NEGATIVE mg/dL
Specific Gravity, Urine: 1.015 (ref 1.005–1.030)
Urobilinogen, UA: 1 mg/dL (ref 0.0–1.0)
pH: 6.5 (ref 5.0–8.0)

## 2012-02-14 LAB — CBC WITH DIFFERENTIAL/PLATELET
Eosinophils Absolute: 0.2 10*3/uL (ref 0.0–0.7)
Hemoglobin: 12.6 g/dL (ref 12.0–15.0)
Lymphocytes Relative: 34 % (ref 12–46)
Lymphs Abs: 2.2 10*3/uL (ref 0.7–4.0)
Monocytes Relative: 10 % (ref 3–12)
Neutro Abs: 3.5 10*3/uL (ref 1.7–7.7)
Neutrophils Relative %: 54 % (ref 43–77)
Platelets: 299 10*3/uL (ref 150–400)
RBC: 3.84 MIL/uL — ABNORMAL LOW (ref 3.87–5.11)
WBC: 6.6 10*3/uL (ref 4.0–10.5)

## 2012-02-14 LAB — COMPREHENSIVE METABOLIC PANEL
ALT: 13 U/L (ref 0–35)
Alkaline Phosphatase: 60 U/L (ref 39–117)
BUN: 11 mg/dL (ref 6–23)
Chloride: 101 mEq/L (ref 96–112)
GFR calc Af Amer: >90 mL/min (ref >90)
Glucose, Bld: 100 mg/dL — ABNORMAL HIGH (ref 70–99)
Potassium: 3.3 mEq/L — ABNORMAL LOW (ref 3.5–5.1)
Sodium: 135 mEq/L (ref 135–145)
Total Bilirubin: 0.1 mg/dL — ABNORMAL LOW (ref 0.3–1.2)
Total Protein: 7 g/dL (ref 6.0–8.3)

## 2012-02-14 LAB — LIPASE, BLOOD: Lipase: 51 U/L (ref 11–59)

## 2012-02-14 LAB — URINE MICROSCOPIC-ADD ON

## 2012-02-14 MED ORDER — MORPHINE SULFATE 4 MG/ML IJ SOLN
4.0000 mg | Freq: Once | INTRAMUSCULAR | Status: AC
  Administered 2012-02-14: 4 mg via INTRAVENOUS
  Filled 2012-02-14: qty 1

## 2012-02-14 MED ORDER — SUCRALFATE 1 G PO TABS: 1.0000 g | ORAL_TABLET | Freq: Four times a day (QID) | ORAL | Status: DC

## 2012-02-14 MED ORDER — PANTOPRAZOLE SODIUM 40 MG IV SOLR
40.0000 mg | Freq: Once | INTRAVENOUS | Status: AC
  Administered 2012-02-14: 40 mg via INTRAVENOUS
  Filled 2012-02-14: qty 40

## 2012-02-14 MED ORDER — ONDANSETRON HCL 8 MG PO TABS: 8.0000 mg | ORAL_TABLET | Freq: Three times a day (TID) | ORAL | Status: DC | PRN

## 2012-02-14 MED ORDER — ONDANSETRON HCL 4 MG/2ML IJ SOLN
4.0000 mg | Freq: Once | INTRAMUSCULAR | Status: AC
  Administered 2012-02-14: 4 mg via INTRAVENOUS
  Filled 2012-02-14: qty 2

## 2012-02-14 MED ORDER — GI COCKTAIL ~~LOC~~
30.0000 mL | Freq: Once | ORAL | Status: AC
  Administered 2012-02-14: 30 mL via ORAL
  Filled 2012-02-14: qty 30

## 2012-02-14 MED ORDER — MORPHINE SULFATE 2 MG/ML IJ SOLN
2.0000 mg | Freq: Once | INTRAMUSCULAR | Status: AC
  Administered 2012-02-14: 2 mg via INTRAVENOUS
  Filled 2012-02-14: qty 1

## 2012-02-14 MED ORDER — OMEPRAZOLE 20 MG PO CPDR: 20.0000 mg | DELAYED_RELEASE_CAPSULE | Freq: Every day | ORAL | Status: DC

## 2012-02-14 MED ORDER — OXYCODONE-ACETAMINOPHEN 5-325 MG PO TABS: 1.0000 | ORAL_TABLET | ORAL | Status: DC | PRN

## 2012-02-14 MED ORDER — FAMOTIDINE 20 MG PO TABS
20.0000 mg | ORAL_TABLET | Freq: Once | ORAL | Status: AC
  Administered 2012-02-14: 20 mg via ORAL
  Filled 2012-02-14: qty 1

## 2012-02-14 NOTE — ED Provider Notes (Addendum)
History     CSN: [id number]  Arrival date & time 02/14/12  0137   First MD Initiated Contact with Patient 02/14/12 0149      Chief Complaint  Patient presents with  . Abdominal Pain    (Consider location/radiation/quality/duration/timing/severity/associated sxs/prior treatment) HPI History provided by pt.   Pt presents w/ c/o constant severe left side pain for the past several months.  Radiates through to back.  Non-exertional, non-pleuritic and not affected by eating or movement, but aggravated by laying flat.  Associated w/ nausea and diarrhea.  Denies fever, vomiting, hematochezia/melena and GU symptoms.  Denies cough but becomes SOB when the pain is at its worst.  Denies trauma.  Past abd surgeries include tubal ligation and cholecystectomy. Per prior chart, pt has been seen for this problem in the ED multiple times.  CT in 11/2011 showed possible constipation and pain also thought to be musculoskeletal.  Had a positive h.pylori in 11/2011.   Past Medical History  Diagnosis Date  . No pertinent past medical history     Past Surgical History  Procedure Date  . Tubal ligaion   . Cholecystectomy   . Tubal ligation   . Cholecystectomy 02/20/2011    Procedure: LAPAROSCOPIC CHOLECYSTECTOMY WITH INTRAOPERATIVE CHOLANGIOGRAM;  Surgeon: Kandis Cocking, MD;  Location: [facility] OR;  Service: General;  Laterality: N/A;    No family history on file.  History  Substance Use Topics  . Smoking status: Current Every Day Smoker  . Smokeless tobacco: Never Used  . Alcohol Use: No    OB History    Grav Para Term Preterm Abortions TAB SAB Ect Mult Living                  Review of Systems  All other systems reviewed and are negative.    Allergies  Review of patient's allergies indicates no known allergies.  Home Medications   Current Outpatient Rx  Name  Route  Sig  Dispense  Refill  . NAPROXEN SODIUM 220 MG PO TABS   Oral   Take 220 mg by mouth 2 (two) times daily as needed. For  pain         . OMEPRAZOLE 20 MG PO CPDR   Oral   Take 1 capsule (20 mg total) by mouth 2 (two) times daily.   20 capsule   3   . TRAMADOL HCL 50 MG PO TABS   Oral   Take 50 mg by mouth every 6 (six) hours as needed. For pain           BP 115/67  Temp 98.3 F (36.8 C) (Oral)  Resp 18  SpO2 97%  LMP 01/08/2012  Physical Exam  Constitutional: She is oriented to person, place, and time.       Uncomfortable appearing  HENT:  Head: Normocephalic and atraumatic.  Eyes:       nml appearance  Neck: Normal range of motion. Neck supple.  Cardiovascular: Normal rate and regular rhythm.   Pulmonary/Chest: Effort normal and breath sounds normal. No respiratory distress.       No pleuritic pain reported  Abdominal: Soft. Bowel sounds are normal. She exhibits no distension.       Diffuse L abdominal and side tenderness  Musculoskeletal: Normal range of motion.       No LE edema/ttp  Neurological: She is alert and oriented to person, place, and time.  Skin: Skin is warm. No pallor.  Psychiatric: She has a normal mood  and affect. Her behavior is normal.    ED Course  Procedures (including critical care time)  Labs Reviewed  CBC WITH DIFFERENTIAL - Abnormal; Notable for the following:    RBC 3.84 (*)     RDW 11.4 (*)     All other components within normal limits  COMPREHENSIVE METABOLIC PANEL  LIPASE, BLOOD  URINALYSIS, ROUTINE W REFLEX MICROSCOPIC   Ct Abdomen Pelvis Wo Contrast  02/14/2012  *RADIOLOGY REPORT*  Clinical Data: Upper abdominal pain  CT ABDOMEN AND PELVIS WITHOUT CONTRAST  Technique:  Multidetector CT imaging of the abdomen and pelvis was performed following the standard protocol without intravenous contrast.  Comparison: 12/02/2011  Findings: 4 mm nodule within the right lower lobe.  Normal heart size.  No pleural or pericardial effusion.  Organ abnormality/lesion detection is limited in the absence of intravenous contrast. Within this limitation, unremarkable  liver, spleen, pancreas, adrenal glands.  Absent gallbladder.  No biliary ductal dilatation.  Symmetric renal size.  No hydronephrosis or hydroureter.  No urinary tract calculi identified.  No bowel obstruction.  No CT evidence for colitis.  Normal appendix.  No free intraperitoneal air or fluid.  No lymphadenopathy.  Normal caliber aorta and branch vessels.  Small fat containing umbilical hernia.  Circumferential bladder wall thickening.  Nonspecific appearance to the uterus and adnexa.  No acute osseous finding.  IMPRESSION: Noncontrast examination has limited sensitivity for detecting organ or bowel pathology.  No hydronephrosis or urinary tract calculi identified.  Circumferential bladder wall thickening is nonspecific.  Correlate with urinalysis.  Nonspecific appearance to the uterus and adnexa.  4 mm right lower lobe nodule. If the patient is at high risk for bronchogenic carcinoma, follow-up chest CT at 1 year is recommended.  If the patient is at low risk, no follow-up is needed.  This recommendation follows the consensus statement: Guidelines for Management of Small Pulmonary Nodules Detected on CT Scans:  A Statement from the Fleischner Society as published in Radiology 2005; 237:395-400.   Original Report Authenticated By: Jearld Lesch, M.D.      1. Abdominal pain       MDM  [phone] F presents w/ chronic but gradually worsening non-traumatic L side pain.  4th visit for same.  On initial exam, afebrile, VS w/in nml range, uncomfortable appearing, abd and side tenderness diffuse, no pleuritic pain reported and no sign of DVT.  History is not concerning for ACS.  Labs unremarkable w/ exception of hematuria.  CT abd/pelvis ordered and is negative for nephrolithiasis but showed R pulmonary nodule.  Results discussed w/ patient. I suspect that pain is d/t gastritis secondary to h.pylori which she was positive for at recent ED visit.  She has a f/u appt scheduled with new PCP on 12/4.  I referred her  to GI and urology.  Her pain is currently improved after IV morphine, protonix and pepcid as well as GI cocktail.  Return precautions discussed.   Forgot to mention to patient that there was an incidental finding of right lung nodule.  Flow manager will not call her with this information.  I attempted to reach her and phone is disconnected.  Her discharge paperwork did tell her to have right lung rechecked by her primary care doctor.          Otilio Miu, PA 02/14/12 0754  Otilio Miu, PA-C 04/09/12 [phone]

## 2012-02-14 NOTE — ED Notes (Signed)
Pt ambulating to bathroom to obtain urine sample. 

## 2012-02-14 NOTE — ED Notes (Addendum)
C/o upper abd pain, R & L, h/o same, seen here for same x3 previously, has had pain for months, relates pain to "ulcers", sometimes dizzy, also diarrhea (denies: fever, nv or bleeding).

## 2012-02-15 ENCOUNTER — Ambulatory Visit: Payer: BC Managed Care – PPO

## 2012-02-15 ENCOUNTER — Ambulatory Visit (INDEPENDENT_AMBULATORY_CARE_PROVIDER_SITE_OTHER): Payer: BC Managed Care – PPO | Admitting: Family Medicine

## 2012-02-15 VITALS — BP 106/71 | HR 82 | Temp 97.9°F | Resp 16 | Ht 61.5 in | Wt 210.0 lb

## 2012-02-15 DIAGNOSIS — G8929 Other chronic pain: Secondary | ICD-10-CM

## 2012-02-15 DIAGNOSIS — R109 Unspecified abdominal pain: Secondary | ICD-10-CM

## 2012-02-15 NOTE — Progress Notes (Signed)
Subjective: 41 year old lady who is here with abdominal pain. She has a history of having abdominal pains a lot over the years. She had her gallbladder out early this year. The past 4 months she's had pretty continuous abdominal pain. She's had pain in his been generalized and moves from one location to another. She has not had specific nausea vomiting. She does not have dysuria but she has had chronic microscopic hematuria. She's been seen in the emergency room and elsewhere number of times. She has had several CT scans of the abdomen and pelvis, none of which have revealed any significant pathology. She was in the emergency room yesterday, and form will make her see a urologist and gastroenterologist as well as to see someone at [facility] family practice. These were all for the middle of December. She came in here today still hurting. She has not had a lot of nausea or vomiting. She has not had a lot of bloating. She does not pass a lot of gas. Bowels move on the loose side. Her extensive chart was reviewed  Objective: Throat clear. Neck supple without nodes. Chest clear. Heart regular without murmurs. Abdomen had normal bowel sounds, soft without organomegaly or masses. She points left upper quadrant as the area of most pain at this time, but is only minimally tender. No CVA tenderness.  [facility] reading (PRIMARY) by  Dr. Alwyn Ren Nonspecific gas Gust Brooms pattern  Assessment: Chronic nonspecific abdominal pain. This is both acute and chronic.  Plan: Try and clean out her got. The migrating nature of the pain makes it unlikely to have a specific etiology other than from her colon. I suspect the past have something to do with it. I'm going to clean her out even though her bowels loose. Give her some MiraLax. Keep appointments with the specialists.Marland Kitchen

## 2012-02-15 NOTE — Patient Instructions (Addendum)
Extra strength Tylenol 2 tablets 3 times daily for pain  (if she takes the oxycodone/acetaminophen pills she should skip a Tylenol pill for each one of them she takes)  MiraLax one dose daily until bowels move loose for several times a day.  Try to keep them loose for 3-4 days, then stop using the miralax and only use when needed.  Keep your appointments with the gastroenterologist, urologist, and [facility] family physician.

## 2012-02-26 ENCOUNTER — Other Ambulatory Visit (HOSPITAL_COMMUNITY)
Admission: RE | Admit: 2012-02-26 | Discharge: 2012-02-26 | Disposition: A | Discharge status: Home / Self Care | Payer: BC Managed Care – PPO | Source: Ambulatory Visit | Attending: Family Medicine | Admitting: Family Medicine

## 2012-02-26 ENCOUNTER — Other Ambulatory Visit: Payer: Self-pay | Admitting: Physician Assistant

## 2012-02-26 DIAGNOSIS — Z0000 Encounter for general adult medical examination without abnormal findings: Secondary | ICD-10-CM | POA: Insufficient documentation

## 2012-03-10 ENCOUNTER — Encounter: Payer: Self-pay | Admitting: Gastroenterology

## 2012-03-10 ENCOUNTER — Encounter (HOSPITAL_COMMUNITY): Payer: Self-pay | Admitting: *Deleted

## 2012-03-10 ENCOUNTER — Ambulatory Visit (INDEPENDENT_AMBULATORY_CARE_PROVIDER_SITE_OTHER): Payer: BC Managed Care – PPO | Admitting: Gastroenterology

## 2012-03-10 VITALS — BP 98/60 | HR 62 | Ht 61.25 in | Wt 208.8 lb

## 2012-03-10 DIAGNOSIS — R1012 Left upper quadrant pain: Secondary | ICD-10-CM | POA: Insufficient documentation

## 2012-03-10 DIAGNOSIS — Z87891 Personal history of nicotine dependence: Secondary | ICD-10-CM | POA: Insufficient documentation

## 2012-03-10 DIAGNOSIS — Z9851 Tubal ligation status: Secondary | ICD-10-CM | POA: Insufficient documentation

## 2012-03-10 DIAGNOSIS — E669 Obesity, unspecified: Secondary | ICD-10-CM | POA: Insufficient documentation

## 2012-03-10 DIAGNOSIS — Z9889 Other specified postprocedural states: Secondary | ICD-10-CM | POA: Insufficient documentation

## 2012-03-10 DIAGNOSIS — Z79899 Other long term (current) drug therapy: Secondary | ICD-10-CM | POA: Insufficient documentation

## 2012-03-10 LAB — CBC WITH DIFFERENTIAL/PLATELET
Basophils Relative: 0 % (ref 0–1)
Eosinophils Absolute: 0.1 10*3/uL (ref 0.0–0.7)
Eosinophils Relative: 1 % (ref 0–5)
Hemoglobin: 12.1 g/dL (ref 12.0–15.0)
Lymphs Abs: 2.2 10*3/uL (ref 0.7–4.0)
MCH: 32 pg (ref 26.0–34.0)
MCHC: 33.9 g/dL (ref 30.0–36.0)
MCV: 94.4 fL (ref 78.0–100.0)
Monocytes Absolute: 0.5 10*3/uL (ref 0.1–1.0)
Monocytes Relative: 7 % (ref 3–12)
Neutrophils Relative %: 63 % (ref 43–77)
RBC: 3.78 MIL/uL — ABNORMAL LOW (ref 3.87–5.11)

## 2012-03-10 LAB — POCT I-STAT TROPONIN I: Troponin i, poc: 0 ng/mL (ref 0.00–0.08)

## 2012-03-10 MED ORDER — ESOMEPRAZOLE MAGNESIUM 40 MG PO CPDR: 40.0000 mg | DELAYED_RELEASE_CAPSULE | Freq: Every day | ORAL | Status: DC

## 2012-03-10 MED ORDER — ONDANSETRON 4 MG PO TBDP
8.0000 mg | ORAL_TABLET | Freq: Once | ORAL | Status: AC
  Administered 2012-03-10: 8 mg via ORAL
  Filled 2012-03-10: qty 2

## 2012-03-10 NOTE — Progress Notes (Signed)
HPI: This is a  very pleasant 41 year old woman who is here with her husband today. She is from a [place], but speaks [place] fairly well and her husband speaks [place] even better.  Has been having 4-5 months of LUQ pains.  The pains are constant, but waxes and wanes.  Eating has no effect.  BMs has not effect.  No nausea, no vomiting.  No fevers or chills.  HAs been gaining weight, in past year a lot of weight (maybe 40 pounds).  THe pain is sharp.  Can peak for hours at a time. Not positional.  She does get pyrosis, with greasy foods.  Takes no meds for it.  Alternates a bit with constipation and diarrhea, no changes recently.    No NSAIDs.  Labs: 01/2012 CBC, cmet, lipase all essentially normal CT scan 01/2012, CT scan 11/2011 both show no clear cause of abd pains.  Review of systems: Pertinent positive and negative review of systems were noted in the above HPI section. Complete review of systems was performed and was otherwise normal.    Past Medical History  Diagnosis Date  . No pertinent past medical history   . Obesity (BMI 30-39.9)     Past Surgical History  Procedure Date  . Tubal ligation   . Cholecystectomy 02/20/2011    Procedure: LAPAROSCOPIC CHOLECYSTECTOMY WITH INTRAOPERATIVE CHOLANGIOGRAM;  Surgeon: Kandis Cocking, MD;  Location: [facility] OR;  Service: General;  Laterality: N/A;    Current Outpatient Prescriptions  Medication Sig Dispense Refill  . omeprazole (PRILOSEC) 20 MG capsule Take 1 capsule (20 mg total) by mouth 2 (two) times daily.  20 capsule  3  . ondansetron (ZOFRAN) 8 MG tablet Take 1 tablet (8 mg total) by mouth every 8 (eight) hours as needed for nausea.  20 tablet  0    Allergies as of 03/10/2012  . (No Known Allergies)    Family History  Problem Relation Age of Onset  . Diabetes Paternal Grandmother   . Diabetes Paternal Grandfather   . Diabetes Maternal Grandmother   . Diabetes Maternal Grandfather   . Diabetes Mother   . Diabetes  Father   . Heart disease Father   . Heart disease Mother     History   Social History  . Marital Status: Married    Spouse Name: N/A    Number of Children: 4  . Years of Education: N/A   Occupational History  . Not on file.   Social History Main Topics  . Smoking status: Former Smoker    Quit date: 03/11/2011  . Smokeless tobacco: Never Used  . Alcohol Use: No  . Drug Use: No  . Sexually Active: Yes   Other Topics Concern  . Not on file   Social History Narrative  . No narrative on file       Physical Exam: BP 98/60  Pulse 62  Ht 5' 1.25" (1.556 m)  Wt 208 lb 12.8 oz (94.711 kg)  BMI 39.13 kg/m2  LMP 02/20/2012 Constitutional: generally well-appearing Psychiatric: alert and oriented x3 Eyes: extraocular movements intact Mouth: oral pharynx moist, no lesions Neck: supple no lymphadenopathy Cardiovascular: heart regular rate and rhythm Lungs: clear to auscultation bilaterally Abdomen: soft, mildly tender left upper quadrant, nondistended, no obvious ascites, no peritoneal signs, normal bowel sounds Extremities: no lower extremity edema bilaterally Skin: no lesions on visible extremities    Assessment and plan: 41 y.o. female with  left upper quadrant abdominal discomfort for 3-4 months  She has  had blood work and to CAT scans without clear etiology. I think the next step is upper endoscopy, perhaps she has gastritis, H. pylori infection, peptic ulcer disease. I see no reason for any further blood tests or imaging studies prior to endoscopy.

## 2012-03-10 NOTE — Patient Instructions (Addendum)
You will be set up for an upper endoscopy (LEC, moderate sedation) for left sided abd pains. Samples of PPI medicine given, take one pill every day until the upper endoscopy.

## 2012-03-10 NOTE — ED Notes (Addendum)
Pt to ED c/o epigastric pain that radiates to back.  States she was tx here several weeks ago and told she possibly had an ulcer.  She was seen at GI doc to day and was told to come back next [facility] for more testing.  Pt states meds she was prescribed last time helped initially, but no longer help.  States pain after eating, bloating and intermittent nausea.  Hx of cholecystectomy.

## 2012-03-11 ENCOUNTER — Emergency Department (HOSPITAL_COMMUNITY)
Admission: EM | Admit: 2012-03-11 | Discharge: 2012-03-11 | Disposition: A | Discharge status: Home / Self Care | Payer: BC Managed Care – PPO | Attending: Emergency Medicine | Admitting: Emergency Medicine

## 2012-03-11 ENCOUNTER — Encounter (HOSPITAL_COMMUNITY): Payer: Self-pay | Admitting: *Deleted

## 2012-03-11 DIAGNOSIS — R1012 Left upper quadrant pain: Secondary | ICD-10-CM

## 2012-03-11 LAB — COMPREHENSIVE METABOLIC PANEL
Albumin: 3.1 g/dL — ABNORMAL LOW (ref 3.5–5.2)
Alkaline Phosphatase: 61 U/L (ref 39–117)
BUN: 13 mg/dL (ref 6–23)
Calcium: 8.5 mg/dL (ref 8.4–10.5)
Creatinine, Ser: 0.62 mg/dL (ref 0.50–1.10)
GFR calc Af Amer: >90 mL/min (ref >90)
Glucose, Bld: 106 mg/dL — ABNORMAL HIGH (ref 70–99)
Total Protein: 7 g/dL (ref 6.0–8.3)

## 2012-03-11 LAB — LIPASE, BLOOD: Lipase: 24 U/L (ref 11–59)

## 2012-03-11 MED ORDER — OXYCODONE-ACETAMINOPHEN 5-325 MG PO TABS: 2.0000 | ORAL_TABLET | ORAL | Status: DC | PRN

## 2012-03-11 MED ORDER — OXYCODONE-ACETAMINOPHEN 5-325 MG PO TABS
2.0000 | ORAL_TABLET | Freq: Once | ORAL | Status: AC
  Administered 2012-03-11: 2 via ORAL
  Filled 2012-03-11: qty 2

## 2012-03-11 MED ORDER — DICYCLOMINE HCL 10 MG/ML IM SOLN
20.0000 mg | Freq: Once | INTRAMUSCULAR | Status: AC
  Administered 2012-03-11: 20 mg via INTRAMUSCULAR
  Filled 2012-03-11: qty 2

## 2012-03-11 MED ORDER — DICYCLOMINE HCL 20 MG PO TABS: 20.0000 mg | ORAL_TABLET | Freq: Four times a day (QID) | ORAL | Status: DC | PRN

## 2012-03-11 MED ORDER — GI COCKTAIL ~~LOC~~
30.0000 mL | Freq: Once | ORAL | Status: AC
  Administered 2012-03-11: 30 mL via ORAL
  Filled 2012-03-11: qty 30

## 2012-03-11 NOTE — ED Notes (Signed)
Pt has had no bloody vomitus or stool

## 2012-03-11 NOTE — ED Provider Notes (Signed)
History     CSN: [id number]  Arrival date & time 03/10/12  2321   First MD Initiated Contact with Patient 03/11/12 0124      Chief Complaint  Patient presents with  . Abdominal Pain    (Consider location/radiation/quality/duration/timing/severity/associated sxs/prior treatment) HPI 41 year old female presents to emergency room complaining of acute on chronic left upper abdominal pain. Patient was seen earlier today by gastroenterology after ED visit last month. She has followup endoscopy. Patient reports none of the medications that she is currently taking is helping with her pain. She denies any fever chills. No change in the nature of her pain. She cannot wait until Monday to be seen again. Per Dr. Christella Hartigan note, he does not anticipate needing any further labs or imaging until endoscopy is completed. He placed her on Nexium, started her on samples. Past Medical History  Diagnosis Date  . No pertinent past medical history   . Obesity (BMI 30-39.9)     Past Surgical History  Procedure Date  . Tubal ligation   . Cholecystectomy 02/20/2011    Procedure: LAPAROSCOPIC CHOLECYSTECTOMY WITH INTRAOPERATIVE CHOLANGIOGRAM;  Surgeon: Kandis Cocking, MD;  Location: [facility] OR;  Service: General;  Laterality: N/A;    Family History  Problem Relation Age of Onset  . Diabetes Paternal Grandmother   . Diabetes Paternal Grandfather   . Diabetes Maternal Grandmother   . Diabetes Maternal Grandfather   . Diabetes Mother   . Diabetes Father   . Heart disease Father   . Heart disease Mother     History  Substance Use Topics  . Smoking status: Former Smoker    Quit date: 03/11/2011  . Smokeless tobacco: Never Used  . Alcohol Use: No    OB History    Grav Para Term Preterm Abortions TAB SAB Ect Mult Living                  Review of Systems  See History of Present Illness; otherwise all other systems are reviewed and negative  Allergies  Review of patient's allergies indicates no  known allergies.  Home Medications   Current Outpatient Rx  Name  Route  Sig  Dispense  Refill  . ONDANSETRON HCL 8 MG PO TABS   Oral   Take 1 tablet (8 mg total) by mouth every 8 (eight) hours as needed for nausea.   20 tablet   0   . DICYCLOMINE HCL 20 MG PO TABS   Oral   Take 1 tablet (20 mg total) by mouth every 6 (six) hours as needed (for abdominal cramping).   20 tablet   0   . OXYCODONE-ACETAMINOPHEN 5-325 MG PO TABS   Oral   Take 2 tablets by mouth every 4 (four) hours as needed for pain.   20 tablet   0     BP 129/77  Pulse 64  Temp 98 F (36.7 C) (Oral)  Resp 18  SpO2 100%  LMP 02/20/2012  Physical Exam  Nursing note and vitals reviewed. Constitutional: She is oriented to person, place, and time. She appears well-developed and well-nourished. She appears distressed (tearful, in pain).  HENT:  Head: Normocephalic and atraumatic.  Nose: Nose normal.  Mouth/Throat: Oropharynx is clear and moist.  Eyes: Conjunctivae normal and EOM are normal. Pupils are equal, round, and reactive to light.  Neck: Normal range of motion. Neck supple. No JVD present. No tracheal deviation present. No thyromegaly present.  Cardiovascular: Normal rate, regular rhythm, normal heart sounds and  intact distal pulses.  Exam reveals no gallop and no friction rub.   No murmur heard. Pulmonary/Chest: Effort normal and breath sounds normal. No stridor. No respiratory distress. She has no wheezes. She has no rales. She exhibits no tenderness.  Abdominal: Soft. Bowel sounds are normal. She exhibits no distension and no mass. There is tenderness (Diffuse tenderness throughout abdomen). There is no rebound and no guarding.  Musculoskeletal: Normal range of motion. She exhibits no edema and no tenderness.  Lymphadenopathy:    She has no cervical adenopathy.  Neurological: She is alert and oriented to person, place, and time. She exhibits normal muscle tone. Coordination normal.  Skin: Skin is  dry. No rash noted. No erythema. No pallor.  Psychiatric: She has a normal mood and affect. Her behavior is normal. Judgment and thought content normal.    ED Course  Procedures (including critical care time)  Labs Reviewed  CBC WITH DIFFERENTIAL - Abnormal; Notable for the following:    RBC 3.78 (*)     HCT 35.7 (*)     RDW 11.3 (*)     All other components within normal limits  COMPREHENSIVE METABOLIC PANEL - Abnormal; Notable for the following:    Glucose, Bld 106 (*)     Albumin 3.1 (*)     Total Bilirubin 0.1 (*)     All other components within normal limits  LIPASE, BLOOD  POCT I-STAT TROPONIN I   No results found.   1. LUQ abdominal pain       MDM  41 year old female with acute on chronic left upper abdominal pain.  Pain controlled with percocet, bentyl.  To f/u with gi as scheduled.        Bethany Mackie, MD 03/11/12 [phone]

## 2012-03-16 ENCOUNTER — Encounter: Payer: Self-pay | Admitting: Gastroenterology

## 2012-03-16 ENCOUNTER — Ambulatory Visit (AMBULATORY_SURGERY_CENTER): Payer: BC Managed Care – PPO | Admitting: Gastroenterology

## 2012-03-16 VITALS — BP 132/75 | HR 71 | Temp 97.8°F | Resp 18 | Ht 61.25 in | Wt 208.0 lb

## 2012-03-16 DIAGNOSIS — R1012 Left upper quadrant pain: Secondary | ICD-10-CM

## 2012-03-16 DIAGNOSIS — K2990 Gastroduodenitis, unspecified, without bleeding: Secondary | ICD-10-CM

## 2012-03-16 DIAGNOSIS — K2970 Gastritis, unspecified, without bleeding: Secondary | ICD-10-CM

## 2012-03-16 DIAGNOSIS — K2960 Other gastritis without bleeding: Secondary | ICD-10-CM

## 2012-03-16 MED ORDER — SODIUM CHLORIDE 0.9 % IV SOLN: 500.0000 mL | INTRAVENOUS | Status: DC

## 2012-03-16 MED ORDER — ESOMEPRAZOLE MAGNESIUM 40 MG PO CPDR: 40.0000 mg | DELAYED_RELEASE_CAPSULE | Freq: Every day | ORAL | Status: DC

## 2012-03-16 NOTE — Op Note (Signed)
[facility] [address] [place], [address]   ENDOSCOPY PROCEDURE REPORT  PATIENT: Bethany, Mcbride  MR#: [id number] BIRTHDATE: 1970/11/23 , 41  yrs. old GENDER: Female ENDOSCOPIST: Rachael Fee, MD PROCEDURE DATE:  03/16/2012 PROCEDURE:  EGD w/ biopsy ASA CLASS:     Class II INDICATIONS:  LUQ, essentially unrevealing CT scan, labs. MEDICATIONS: Fentanyl 25 mcg IV, Versed 4 mg IV, and These medications were titrated to patient response per physician's verbal order TOPICAL ANESTHETIC: Cetacaine Spray  DESCRIPTION OF PROCEDURE: After the risks benefits and alternatives of the procedure were thoroughly explained, informed consent was obtained.  The FUSE Demo Scope endoscope was introduced through the mouth and advanced to the second portion of the duodenum. Without limitations.  The instrument was slowly withdrawn as the mucosa was fully examined.     Images taken but only available in hard copy form that will be scanned into [employer]).  There was mild distal gastritis, biopsies taken and sen to pathology.  The examination was otherwise normal.  Retroflexed views revealed no abnormalities.     The scope was then withdrawn from the patient and the procedure completed.  COMPLICATIONS: There were no complications.  ENDOSCOPIC IMPRESSION: There was mild distal gastritis, biopsies taken and sen to pathology. The examination was otherwise normal.  RECOMMENDATIONS: If biopsies show H.  pylori, you will be started on appropriate antibiotics.  Continue nexium once daily for now.    eSigned:  Rachael Fee, MD 03/16/2012 10:41 AM

## 2012-03-16 NOTE — ED Provider Notes (Signed)
Medical screening examination/treatment/procedure(s) were performed by non-physician practitioner and as supervising physician I was immediately available for consultation/collaboration.  Daniell Paradise Lytle Michaels, MD 03/16/12 [phone]

## 2012-03-16 NOTE — Progress Notes (Signed)
Patient did not experience any of the following events: a burn prior to discharge; a fall within the facility; wrong site/side/patient/procedure/implant event; or a hospital transfer or hospital admission upon discharge from the facility. (G8907) Patient did not have preoperative order for IV antibiotic SSI prophylaxis. (G8918)  

## 2012-03-16 NOTE — Progress Notes (Signed)
Emeline Gins and Danae Orleans, Endo choice representatives, in room during procedure.

## 2012-03-16 NOTE — Patient Instructions (Addendum)
Discharge instructions given with verbal understanding. Handout on gastritis given. Resume previous medications. YOU HAD AN ENDOSCOPIC PROCEDURE TODAY AT THE [facility]: Refer to the procedure report that was given to you for any specific questions about what was found during the examination.  If the procedure report does not answer your questions, please call your gastroenterologist to clarify.  If you requested that your care partner not be given the details of your procedure findings, then the procedure report has been included in a sealed envelope for you to review at your convenience later.  YOU SHOULD EXPECT: Some feelings of bloating in the abdomen. Passage of more gas than usual.  Walking can help get rid of the air that was put into your GI tract during the procedure and reduce the bloating. If you had a lower endoscopy (such as a colonoscopy or flexible sigmoidoscopy) you may notice spotting of blood in your stool or on the toilet paper. If you underwent a bowel prep for your procedure, then you may not have a normal bowel movement for a few days.  DIET: Your first meal following the procedure should be a light meal and then it is ok to progress to your normal diet.  A half-sandwich or bowl of soup is an example of a good first meal.  Heavy or fried foods are harder to digest and may make you feel nauseous or bloated.  Likewise meals heavy in dairy and vegetables can cause extra gas to form and this can also increase the bloating.  Drink plenty of fluids but you should avoid alcoholic beverages for 24 hours.  ACTIVITY: Your care partner should take you home directly after the procedure.  You should plan to take it easy, moving slowly for the rest of the day.  You can resume normal activity the day after the procedure however you should NOT DRIVE or use heavy machinery for 24 hours (because of the sedation medicines used during the test).    SYMPTOMS TO REPORT IMMEDIATELY: A  gastroenterologist can be reached at any hour.  During normal business hours, 8:30 AM to 5:00 PM Monday through Friday, call [phone].  After hours and on weekends, please call the GI answering service at [phone] who will take a message and have the physician on call contact you.   Following upper endoscopy (EGD)  Vomiting of blood or coffee ground material  New chest pain or pain under the shoulder blades  Painful or persistently difficult swallowing  New shortness of breath  Fever of 100F or higher  Black, tarry-looking stools  FOLLOW UP: If any biopsies were taken you will be contacted by phone or by letter within the next 1-3 weeks.  Call your gastroenterologist if you have not heard about the biopsies in 3 weeks.  Our staff will call the home number listed on your records the next business day following your procedure to check on you and address any questions or concerns that you may have at that time regarding the information given to you following your procedure. This is a courtesy call and so if there is no answer at the home number and we have not heard from you through the emergency physician on call, we will assume that you have returned to your regular daily activities without incident.  SIGNATURES/CONFIDENTIALITY: You and/or your care partner have signed paperwork which will be entered into your electronic medical record.  These signatures attest to the fact that that the information above on   your After Visit Summary has been reviewed and is understood.  Full responsibility of the confidentiality of this discharge information lies with you and/or your care-partner. 

## 2012-03-17 ENCOUNTER — Telehealth: Payer: Self-pay | Admitting: *Deleted

## 2012-03-17 NOTE — Telephone Encounter (Signed)
When # given was called, recording said "number dialed is unreachable".  Unable to leave message.

## 2012-03-28 ENCOUNTER — Encounter: Payer: Self-pay | Admitting: Gastroenterology

## 2012-04-09 NOTE — ED Provider Notes (Signed)
Medical screening examination/treatment/procedure(s) were performed by non-physician practitioner and as supervising physician I was immediately available for consultation/collaboration.  Rosanne Ashing, MD 04/09/12 2259

## 2012-08-04 ENCOUNTER — Other Ambulatory Visit: Payer: Self-pay

## 2012-08-04 ENCOUNTER — Emergency Department (HOSPITAL_COMMUNITY)
Admission: EM | Admit: 2012-08-04 | Discharge: 2012-08-04 | Disposition: A | Discharge status: Home / Self Care | Payer: BC Managed Care – PPO | Attending: Emergency Medicine | Admitting: Emergency Medicine

## 2012-08-04 ENCOUNTER — Encounter (HOSPITAL_COMMUNITY): Payer: Self-pay | Admitting: Emergency Medicine

## 2012-08-04 ENCOUNTER — Emergency Department (HOSPITAL_COMMUNITY): Payer: BC Managed Care – PPO

## 2012-08-04 DIAGNOSIS — Z9089 Acquired absence of other organs: Secondary | ICD-10-CM | POA: Insufficient documentation

## 2012-08-04 DIAGNOSIS — Z9851 Tubal ligation status: Secondary | ICD-10-CM | POA: Insufficient documentation

## 2012-08-04 DIAGNOSIS — E669 Obesity, unspecified: Secondary | ICD-10-CM | POA: Insufficient documentation

## 2012-08-04 DIAGNOSIS — Z87891 Personal history of nicotine dependence: Secondary | ICD-10-CM | POA: Insufficient documentation

## 2012-08-04 DIAGNOSIS — R079 Chest pain, unspecified: Secondary | ICD-10-CM | POA: Insufficient documentation

## 2012-08-04 LAB — POCT I-STAT, CHEM 8
BUN: 10 mg/dL (ref 6–23)
Calcium, Ion: 1.19 mmol/L (ref 1.12–1.23)
Glucose, Bld: 92 mg/dL (ref 70–99)
TCO2: 26 mmol/L (ref 0–100)

## 2012-08-04 LAB — URINE MICROSCOPIC-ADD ON

## 2012-08-04 LAB — URINALYSIS, ROUTINE W REFLEX MICROSCOPIC
Nitrite: POSITIVE — AB
Protein, ur: NEGATIVE mg/dL
Specific Gravity, Urine: 1.014 (ref 1.005–1.030)
Urobilinogen, UA: 0.2 mg/dL (ref 0.0–1.0)

## 2012-08-04 LAB — RAPID URINE DRUG SCREEN, HOSP PERFORMED
Amphetamines: NOT DETECTED
Opiates: NOT DETECTED
Tetrahydrocannabinol: NOT DETECTED

## 2012-08-04 MED ORDER — HYDROCODONE-ACETAMINOPHEN 5-325 MG PO TABS: 2.0000 | ORAL_TABLET | ORAL | Status: DC | PRN

## 2012-08-04 MED ORDER — ACETAMINOPHEN 325 MG PO TABS
650.0000 mg | ORAL_TABLET | Freq: Once | ORAL | Status: AC
  Administered 2012-08-04: 650 mg via ORAL
  Filled 2012-08-04: qty 2

## 2012-08-04 NOTE — ED Notes (Signed)
Up to br w/ assist of husband to obtain ua spec

## 2012-08-04 NOTE — ED Notes (Signed)
Discharge instructions reviewed with pt and husband. Both verbalized understanding.

## 2012-08-04 NOTE — ED Notes (Signed)
Pt up w/ assist of husband to br to obtain ua spec pt drinking water

## 2012-08-04 NOTE — ED Notes (Signed)
Patient complaining of left upper abdominal pain that is radiating around into back and right side. Patient states pain began three days ago. Patient denies any nausea or vomiting.

## 2012-08-04 NOTE — ED Provider Notes (Signed)
History     CSN: [id number]  Arrival date & time 08/04/12  0544   First MD Initiated Contact with Patient 08/04/12 0700      Chief Complaint  Patient presents with  . Abdominal Pain    (Consider location/radiation/quality/duration/timing/severity/associated sxs/prior treatment) HPI Comments: [name] is a 42 y.o. female who states that she is having pain in her left and right chest that radiates to her left flank for 2 days. It is similar to pain. She has had, in the emergency department, for the same pain; no etiology has been found. She does not have a primary care Dr. The pain is dull and worse with deep breathing, and movement. It has not affected her appetite. She denies nausea, vomiting, weakness, or dizziness. No cough, shortness of headache, back, pain or trouble walking She has been using Aleve with partial relief. There no other known modifying factors .  Patient is a 42 y.o. female presenting with abdominal pain. The history is provided by the patient.  Abdominal Pain   Past Medical History  Diagnosis Date  . No pertinent past medical history   . Obesity (BMI 30-39.9)     Past Surgical History  Procedure Laterality Date  . Tubal ligation    . Cholecystectomy  02/20/2011    Procedure: LAPAROSCOPIC CHOLECYSTECTOMY WITH INTRAOPERATIVE CHOLANGIOGRAM;  Surgeon: Kandis Cocking, MD;  Location: [facility] OR;  Service: General;  Laterality: N/A;    Family History  Problem Relation Age of Onset  . Diabetes Paternal Grandmother   . Diabetes Paternal Grandfather   . Diabetes Maternal Grandmother   . Diabetes Maternal Grandfather   . Diabetes Mother   . Heart disease Mother   . Diabetes Father   . Heart disease Father   . Colon cancer Neg Hx   . Rectal cancer Neg Hx   . Stomach cancer Neg Hx   . Esophageal cancer Neg Hx     History  Substance Use Topics  . Smoking status: Former Smoker    Quit date: 03/11/2011  . Smokeless tobacco: Never Used  . Alcohol Use: No     OB History   Grav Para Term Preterm Abortions TAB SAB Ect Mult Living                  Review of Systems  Gastrointestinal: Positive for abdominal pain.  All other systems reviewed and are negative.    Allergies  Review of patient's allergies indicates no known allergies.  Home Medications   Current Outpatient Rx  Name  Route  Sig  Dispense  Refill  . naproxen sodium (ANAPROX) 220 MG tablet   Oral   Take 220 mg by mouth 2 (two) times daily as needed (for pain).         Marland Kitchen HYDROcodone-acetaminophen (NORCO) 5-325 MG per tablet   Oral   Take 2 tablets by mouth every 4 (four) hours as needed for pain.   10 tablet   0     BP 126/79  Pulse 62  Temp(Src) 98.9 F (37.2 C) (Oral)  Resp 18  SpO2 98%  LMP 07/28/2012  Physical Exam  Nursing note and vitals reviewed. Constitutional: She is oriented to person, place, and time. She appears well-developed and well-nourished.  HENT:  Head: Normocephalic and atraumatic.  Eyes: Conjunctivae and EOM are normal. Pupils are equal, round, and reactive to light.  Neck: Normal range of motion and phonation normal. Neck supple.  Cardiovascular: Normal rate, regular rhythm and intact  distal pulses.   Pulmonary/Chest: Effort normal and breath sounds normal. She exhibits tenderness (Bilateral anterior tenderness, without crepitation).  Abdominal: Soft. She exhibits no distension. There is no tenderness. There is no guarding.  Musculoskeletal: Normal range of motion.  Neurological: She is alert and oriented to person, place, and time. She has normal strength. She exhibits normal muscle tone.  Skin: Skin is warm and dry.  Psychiatric: Her behavior is normal. Judgment and thought content normal.  Appears depressed    ED Course  Procedures (including critical care time)      Date: 08/04/12  Rate: 72  Rhythm: normal sinus rhythm  QRS Axis: normal  PR and QT Intervals: normal  ST/T Wave abnormalities: normal  PR and QRS  Conduction Disutrbances:none  Narrative Interpretation:   Old EKG Reviewed: unchanged- 01/27/12            Labs Reviewed  URINALYSIS, ROUTINE W REFLEX MICROSCOPIC - Abnormal; Notable for the following:    Hgb urine dipstick LARGE (*)    Nitrite POSITIVE (*)    Leukocytes, UA SMALL (*)    All other components within normal limits  URINE MICROSCOPIC-ADD ON - Abnormal; Notable for the following:    Bacteria, UA FEW (*)    All other components within normal limits  URINE CULTURE  URINE RAPID DRUG SCREEN (HOSP PERFORMED)  URINALYSIS, ROUTINE W REFLEX MICROSCOPIC  URINE RAPID DRUG SCREEN (HOSP PERFORMED)  POCT I-STAT, CHEM 8   Dg Chest 2 View  08/04/2012  *RADIOLOGY REPORT*  Clinical Data: Bilateral chest pain.  CHEST - 2 VIEW  Comparison: PA and lateral chest 01/27/2012.  Findings: Lungs are clear.  Heart size is normal.  No pneumothorax or pleural fluid.  No focal bony abnormality.  IMPRESSION: Negative chest.   Original Report Authenticated By: Holley Dexter, M.D.      1. Chest pain, unspecified       MDM  Nonspecific recurrent pain, this time, more in chest and abdomen. She's had full comprehensive evaluations for this same pain, in the past. Doubt pneumonia, ACS, colitis, UTI, pancreatitis, occult infection, metabolic instability, or impending vascular collapse. She is stable for discharge.  Nursing Notes Reviewed/ Care Coordinated, and agree without changes. Applicable Imaging Reviewed.  Interpretation of Laboratory Data incorporated into ED treatment   Plan: Home Medications- Norco; Home Treatments- rest; Recommended follow up- PCP of choice prn          Flint Melter, MD 08/04/12 1629

## 2012-08-06 LAB — URINE CULTURE: Colony Count: 30000

## 2012-12-17 ENCOUNTER — Ambulatory Visit (INDEPENDENT_AMBULATORY_CARE_PROVIDER_SITE_OTHER): Payer: BC Managed Care – PPO | Admitting: Family Medicine

## 2012-12-17 VITALS — BP 126/88 | HR 78 | Temp 98.8°F | Resp 16 | Ht 62.0 in | Wt 213.6 lb

## 2012-12-17 DIAGNOSIS — R0789 Other chest pain: Secondary | ICD-10-CM

## 2012-12-17 DIAGNOSIS — R9389 Abnormal findings on diagnostic imaging of other specified body structures: Secondary | ICD-10-CM

## 2012-12-17 DIAGNOSIS — R209 Unspecified disturbances of skin sensation: Secondary | ICD-10-CM

## 2012-12-17 DIAGNOSIS — R3589 Other polyuria: Secondary | ICD-10-CM

## 2012-12-17 DIAGNOSIS — R202 Paresthesia of skin: Secondary | ICD-10-CM

## 2012-12-17 DIAGNOSIS — R071 Chest pain on breathing: Secondary | ICD-10-CM

## 2012-12-17 DIAGNOSIS — R358 Other polyuria: Secondary | ICD-10-CM

## 2012-12-17 DIAGNOSIS — R631 Polydipsia: Secondary | ICD-10-CM

## 2012-12-17 LAB — POCT GLYCOSYLATED HEMOGLOBIN (HGB A1C): Hemoglobin A1C: 5.2

## 2012-12-17 LAB — POCT CBC
Granulocyte percent: 55.2 % (ref 37–80)
HCT, POC: 38.4 % (ref 37.7–47.9)
Hemoglobin: 12 g/dL — AB (ref 12.2–16.2)
Lymph, poc: 1.4 (ref 0.6–3.4)
MCH, POC: 32.3 pg — AB (ref 27–31.2)
MCHC: 31.3 g/dL — AB (ref 31.8–35.4)
MCV: 103.3 fL — AB (ref 80–97)
MID (cbc): 0.3 (ref 0–0.9)
MPV: 7.7 fL (ref 0–99.8)
POC Granulocyte: 2.1 (ref 2–6.9)
POC LYMPH PERCENT: 37.8 %L (ref 10–50)
POC MID %: 7 % (ref 0–12)
Platelet Count, POC: 330 10*3/uL (ref 142–424)
RBC: 3.72 M/uL — AB (ref 4.04–5.48)
RDW, POC: 12.2 %
WBC: 3.8 10*3/uL — AB (ref 4.6–10.2)

## 2012-12-17 LAB — POCT UA - MICROSCOPIC ONLY
Casts, Ur, LPF, POC: NEGATIVE
Crystals, Ur, HPF, POC: NEGATIVE
Yeast, UA: NEGATIVE

## 2012-12-17 LAB — POCT URINALYSIS DIPSTICK
Bilirubin, UA: NEGATIVE
Glucose, UA: NEGATIVE
Ketones, UA: NEGATIVE
Leukocytes, UA: NEGATIVE
Nitrite, UA: NEGATIVE
Protein, UA: NEGATIVE
Spec Grav, UA: <=1.005
Urobilinogen, UA: 0.2
pH, UA: 6.5

## 2012-12-17 MED ORDER — HYDROCODONE-ACETAMINOPHEN 5-325 MG PO TABS: 1.0000 | ORAL_TABLET | Freq: Three times a day (TID) | ORAL | Status: DC | PRN

## 2012-12-17 MED ORDER — MELOXICAM 15 MG PO TABS: 15.0000 mg | ORAL_TABLET | Freq: Every day | ORAL | Status: DC

## 2012-12-17 NOTE — Patient Instructions (Signed)

## 2012-12-17 NOTE — Progress Notes (Signed)
[facility]:  Office Visit  Chief Complaint:  Chief Complaint  Patient presents with  . Back Pain    Pain radiates to arms and both sides of upper body, for 3 days    HPI: [name] is a 42 y.o. female who complains of moderate to severe 3 day history of bilateral pain on both sides of her chest, underneath her breast and also has bilateral arm pain. She denies any new injuries. No releif with otc meds. Denies SOB, palpitations.  She has had pain off an on for many years, but comes and goes, last time she went to the ER for this was in 07/2012 This time it did not go away, but she did not get any releif from excedin Has associated numbness and tingling in her arms and hands She does not know if she has diabetes.  Has had nausea when the pain gets severe. However she had not had any vomiting.  She has neck pain but still able to move her neck, She denies rashes She has had HA,  She been more thirsty and urinating more. She denies unintentional weight loss, she denies fevers, chills cough or URI sxs  She is a tobacco user, chews tobacco does not smoke it.  The pain can occur at anytime, at rest or with movement and ROM She has had pain for the last 3 days, more constant   She has had multiple cxr and also 2 CT abd and pelvis scans for similar pain. Her last CT scan doen in 01/2012 showed the folowing. She had a CT scan in 11/2011 which did not show any lung nodule.   Noncontrast examination has limited sensitivity for detecting organ  or bowel pathology.  No hydronephrosis or urinary tract calculi identified.  Circumferential bladder wall thickening is nonspecific. Correlate  with urinalysis.  Nonspecific appearance to the uterus and adnexa.  4 mm right lower lobe nodule. If the patient is at high risk for  bronchogenic carcinoma, follow-up chest CT at 1 year is  recommended. If the patient is at low risk, no follow-up is  needed. This recommendation follows  the consensus statement:  Guidelines for Management of Small Pulmonary Nodules Detected on CT  Scans: A Statement from the Fleischner Society as published in  Radiology 2005; 237:395-400.    Past Medical History  Diagnosis Date  . No pertinent past medical history   . Obesity (BMI 30-39.9)    Past Surgical History  Procedure Laterality Date  . Tubal ligation    . Cholecystectomy  02/20/2011    Procedure: LAPAROSCOPIC CHOLECYSTECTOMY WITH INTRAOPERATIVE CHOLANGIOGRAM;  Surgeon: Kandis Cocking, MD;  Location: [facility] OR;  Service: General;  Laterality: N/A;   History   Social History  . Marital Status: Married    Spouse Name: N/A    Number of Children: 4  . Years of Education: N/A   Social History Main Topics  . Smoking status: Former Smoker    Quit date: 03/11/2011  . Smokeless tobacco: Current User  . Alcohol Use: No  . Drug Use: No  . Sexual Activity: Yes   Other Topics Concern  . None   Social History Narrative  . None   Family History  Problem Relation Age of Onset  . Diabetes Paternal Grandmother   . Diabetes Paternal Grandfather   . Diabetes Maternal Grandmother   . Diabetes Maternal Grandfather   . Diabetes Mother   . Heart disease Mother   .  Diabetes Father   . Heart disease Father   . Colon cancer Neg Hx   . Rectal cancer Neg Hx   . Stomach cancer Neg Hx   . Esophageal cancer Neg Hx    No Known Allergies Prior to Admission medications   Medication Sig Start Date End Date Taking? Authorizing Provider  naproxen sodium (ANAPROX) 220 MG tablet Take 220 mg by mouth 2 (two) times daily as needed (for pain).   Yes Historical Provider, MD     ROS: The patient denies fevers, chills, night sweats, unintentional weight loss, palpitations, wheezing, dyspnea on exertion,  vomiting, abdominal pain, dysuria, hematuria, melena.   All other systems have been reviewed and were otherwise negative with the exception of those mentioned in the HPI and as above.     PHYSICAL EXAM: Filed Vitals:   12/17/12 1808  BP: 126/88  Pulse: 78  Temp: 98.8 F (37.1 C)  Resp: 16   Filed Vitals:   12/17/12 1808  Height: 5\' 2"  (1.575 m)  Weight: 213 lb 9.6 oz (96.888 kg)   Body mass index is 39.06 kg/(m^2).  General: Alert,mild acute distress, obese female HEENT:  Normocephalic, atraumatic, oropharynx patent. EOMI, PERRLA Cardiovascular:  Regular rate and rhythm, no rubs murmurs or gallops.  No Carotid bruits, radial pulse intact. No pedal edema. + chest wall pain and tenderness with movement and palpation, prominent xiphoid process Respiratory: Clear to auscultation bilaterally.  No wheezes, rales, or rhonchi.  No cyanosis, no use of accessory musculature GI: No organomegaly, abdomen is soft and non-tender, positive bowel sounds.  No masses. Skin: No rashes. Neurologic: Facial musculature symmetric. Psychiatric: Patient is appropriate throughout our interaction. Lymphatic: No cervical lymphadenopathy Musculoskeletal: Gait intact.   LABS: Results for orders placed in visit on 12/17/12  POCT CBC      Result Value Range   WBC 3.8 (*) 4.6 - 10.2 K/uL   Lymph, poc 1.4  0.6 - 3.4   POC LYMPH PERCENT 37.8  10 - 50 %L   MID (cbc) 0.3  0 - 0.9   POC MID % 7.0  0 - 12 %M   POC Granulocyte 2.1  2 - 6.9   Granulocyte percent 55.2  37 - 80 %G   RBC 3.72 (*) 4.04 - 5.48 M/uL   Hemoglobin 12.0 (*) 12.2 - 16.2 g/dL   HCT, POC 16.1  09.6 - 47.9 %   MCV 103.3 (*) 80 - 97 fL   MCH, POC 32.3 (*) 27 - 31.2 pg   MCHC 31.3 (*) 31.8 - 35.4 g/dL   RDW, POC 04.5     Platelet Count, POC 330  142 - 424 K/uL   MPV 7.7  0 - 99.8 fL  POCT UA - MICROSCOPIC ONLY      Result Value Range   WBC, Ur, HPF, POC 0-2     RBC, urine, microscopic 6-12     Bacteria, U Microscopic trace     Mucus, UA trace     Epithelial cells, urine per micros 1-4     Crystals, Ur, HPF, POC neg     Casts, Ur, LPF, POC neg     Yeast, UA neg    POCT URINALYSIS DIPSTICK      Result Value  Range   Color, UA yellow     Clarity, UA clear     Glucose, UA neg     Bilirubin, UA neg     Ketones, UA neg     Spec Grav,  UA <=1.005     Blood, UA large     pH, UA 6.5     Protein, UA neg     Urobilinogen, UA 0.2     Nitrite, UA neg     Leukocytes, UA Negative    POCT GLYCOSYLATED HEMOGLOBIN (HGB A1C)      Result Value Range   Hemoglobin A1C 5.2       EKG/XRAY:   Primary read interpreted by Dr. Conley Rolls at [facility].   ASSESSMENT/PLAN: Encounter Diagnoses  Name Primary?  . Polydipsia Yes  . Polyuria   . Chest wall pain   . Paresthesia   . Abnormal CT scan    This is a pleasant heavy set [place] female who presents with a 3 day history of moderate to severe chest wall pain, underneath her breast and along her ribs, feels similar to what she has had before but is not being relied by otc meds. She has had this intermittently for the last 10 years with some episodes require going to the ER. This may be just costochondritis or something more significant since she has a prior history of solitary lung nodule and is a tobacco user. I do not suspect it is cardiac related since these sxs are bilateral and she has had it for 10 yrs intermittently.  She has had multiple chest xrays and has had 2 abd CT/Pelvis.  Labs pending Will get CT chest with and without contract to rule out any malignancy Gross sideeffects, risk and benefits, and alternatives of medications d/w patient. Patient is aware that all medications have potential sideeffects and we are unable to predict every sideeffect or drug-drug interaction that may occur.  Hamilton Capri PHUONG, DO 12/18/2012 2:45 PM

## 2012-12-18 LAB — COMPREHENSIVE METABOLIC PANEL
Albumin: 3.7 g/dL (ref 3.5–5.2)
BUN: 8 mg/dL (ref 6–23)
CO2: 24 mEq/L (ref 19–32)
Glucose, Bld: 79 mg/dL (ref 70–99)
Potassium: 4.2 mEq/L (ref 3.5–5.3)
Sodium: 136 mEq/L (ref 135–145)
Total Protein: 7.2 g/dL (ref 6.0–8.3)

## 2012-12-18 LAB — COMPREHENSIVE METABOLIC PANEL WITH GFR
ALT: 13 U/L (ref 0–35)
AST: 21 U/L (ref 0–37)
Alkaline Phosphatase: 55 U/L (ref 39–117)
Calcium: 8.7 mg/dL (ref 8.4–10.5)
Chloride: 102 meq/L (ref 96–112)
Creat: 0.6 mg/dL (ref 0.50–1.10)
Total Bilirubin: 0.2 mg/dL — ABNORMAL LOW (ref 0.3–1.2)

## 2012-12-18 LAB — TSH: TSH: 1.382 u[IU]/mL (ref 0.350–4.500)

## 2012-12-25 ENCOUNTER — Telehealth: Payer: Self-pay | Admitting: Radiology

## 2012-12-25 ENCOUNTER — Other Ambulatory Visit: Payer: Self-pay | Admitting: Radiology

## 2012-12-25 NOTE — Telephone Encounter (Signed)
Patients CT Chest has been denied, denial indicate patient has not had a recent abnormal chest xray.

## 2012-12-27 NOTE — Telephone Encounter (Signed)
-----   Message from Lenell Antu, DO sent at 12/26/2012 11:59 PM ----- Can we use the dx code solitary lung nodule on prior CT scan On prior auth to see if they will approve, Thanks

## 2013-01-05 NOTE — Telephone Encounter (Signed)
Can we reorder the CT scan order with the additional information?

## 2013-01-11 NOTE — Telephone Encounter (Signed)
Lupita Leash, has this been done,?the encounter is still open Dr Conley Rolls wants to reorder the scan.

## 2013-05-29 ENCOUNTER — Ambulatory Visit (INDEPENDENT_AMBULATORY_CARE_PROVIDER_SITE_OTHER): Payer: BC Managed Care – PPO | Admitting: Family Medicine

## 2013-05-29 VITALS — BP 106/74 | HR 88 | Temp 98.6°F | Resp 16 | Ht 61.5 in | Wt 209.0 lb

## 2013-05-29 DIAGNOSIS — R1084 Generalized abdominal pain: Secondary | ICD-10-CM

## 2013-05-29 DIAGNOSIS — R911 Solitary pulmonary nodule: Secondary | ICD-10-CM

## 2013-05-29 DIAGNOSIS — R350 Frequency of micturition: Secondary | ICD-10-CM

## 2013-05-29 DIAGNOSIS — R141 Gas pain: Secondary | ICD-10-CM

## 2013-05-29 DIAGNOSIS — Z87891 Personal history of nicotine dependence: Secondary | ICD-10-CM

## 2013-05-29 DIAGNOSIS — R140 Abdominal distension (gaseous): Secondary | ICD-10-CM

## 2013-05-29 DIAGNOSIS — R143 Flatulence: Secondary | ICD-10-CM

## 2013-05-29 DIAGNOSIS — R12 Heartburn: Secondary | ICD-10-CM

## 2013-05-29 DIAGNOSIS — R109 Unspecified abdominal pain: Secondary | ICD-10-CM

## 2013-05-29 DIAGNOSIS — R142 Eructation: Secondary | ICD-10-CM

## 2013-05-29 DIAGNOSIS — R3129 Other microscopic hematuria: Secondary | ICD-10-CM

## 2013-05-29 LAB — POCT UA - MICROSCOPIC ONLY
Casts, Ur, LPF, POC: NEGATIVE
Crystals, Ur, HPF, POC: NEGATIVE
Mucus, UA: NEGATIVE
Yeast, UA: NEGATIVE

## 2013-05-29 LAB — POCT CBC
GRANULOCYTE PERCENT: 56.7 % (ref 37–80)
HCT, POC: 42.5 % (ref 37.7–47.9)
Hemoglobin: 13.7 g/dL (ref 12.2–16.2)
Lymph, poc: 2 (ref 0.6–3.4)
MCH, POC: 32.2 pg — AB (ref 27–31.2)
MCHC: 32.2 g/dL (ref 31.8–35.4)
MCV: 99.7 fL — AB (ref 80–97)
MID (CBC): 0.3 (ref 0–0.9)
MPV: 7.8 fL (ref 0–99.8)
PLATELET COUNT, POC: 416 10*3/uL (ref 142–424)
POC GRANULOCYTE: 3 (ref 2–6.9)
POC LYMPH PERCENT: 37.1 %L (ref 10–50)
POC MID %: 5.2 % (ref 0–12)
RBC: 4.26 M/uL (ref 4.04–5.48)
RDW, POC: 12.6 %
WBC: 5.3 10*3/uL (ref 4.6–10.2)

## 2013-05-29 LAB — POCT URINALYSIS DIPSTICK
Bilirubin, UA: NEGATIVE
GLUCOSE UA: NEGATIVE
KETONES UA: NEGATIVE
LEUKOCYTES UA: NEGATIVE
Nitrite, UA: NEGATIVE
Protein, UA: NEGATIVE
SPEC GRAV UA: 1.01
Urobilinogen, UA: 0.2
pH, UA: 7

## 2013-05-29 LAB — POCT URINE PREGNANCY: PREG TEST UR: NEGATIVE

## 2013-05-29 MED ORDER — OMEPRAZOLE 20 MG PO CPDR: 20.0000 mg | DELAYED_RELEASE_CAPSULE | Freq: Every day | ORAL | Status: DC

## 2013-05-29 NOTE — Progress Notes (Signed)
Subjective:    Patient ID: [name], female    DOB: September 24, 1970, 43 y.o.   MRN: [id number]  HPI [name] is a 43 y.o. female  C/o abdominal pain.  Both sides - upper abdomen - both sides. Sx's for years, comes and goes. Current sx's about 2-3 weeks. Left greater than right side with sx's past few days.  Feels worse past few days.  No fever, no n/v, no diarrhea, no blood in stool, no dark/tarry stools.  No known hx of PUD, but has had heartburn with this pain - only some times.  Notes heartburn more with greasy foods. No unexplained wt loss, no night sweats.   BM every 2- 3 days. Denies constipation.  S/p lap cholecystectomy in 2012.  No hx of eval with GI for prior pain.  LMP 2 weeks ago - normal.   No daily meds, but has taken alleve at times without relief- 6 times in past week.   Per record review - mild distal gastritis noted on endoscopy 02/2012 - Dr. Christella HartiganJacobs   Seen 11/2012 - lower chest pain then, hx of 4mm lung nodule on CT scan in 2013. Former smoker. Plan for repeat CT chest last year, but has not yet had. Has not had call to schedule this, but new phone number put in system today.  Quit smoking 10 years ago, only smoked 4-5 years prior - 1-2 packs per day (5-10 pack year). Same pain when seen by Dr. Conley RollsLe - lower chest/upper abdomen.      Patient Active Problem List   Diagnosis Date Noted  . LUQ abdominal pain 12/14/2011  . Cholecystitis, acute with cholelithiasis 02/20/2011  . Obesity (BMI 30-39.9) 02/20/2011  . UTI (lower urinary tract infection) 02/20/2011   Past Medical History  Diagnosis Date  . No pertinent past medical history   . Obesity (BMI 30-39.9)    Past Surgical History  Procedure Laterality Date  . Tubal ligation    . Cholecystectomy  02/20/2011    Procedure: LAPAROSCOPIC CHOLECYSTECTOMY WITH INTRAOPERATIVE CHOLANGIOGRAM;  Surgeon: Kandis Cockingavid H Newman, MD;  Location: [facility] OR;  Service: General;  Laterality: N/A;   No Known Allergies Prior to  Admission medications   Medication Sig Start Date End Date Taking? Authorizing Provider  HYDROcodone-acetaminophen (NORCO) 5-325 MG per tablet Take 1 tablet by mouth every 8 (eight) hours as needed for pain. 12/17/12   Thao P Le, DO  meloxicam (MOBIC) 15 MG tablet Take 1 tablet (15 mg total) by mouth daily. Take with food, no other NSAIDs 12/17/12   Thao P Le, DO   History   Social History  . Marital Status: Married    Spouse Name: N/A    Number of Children: 4  . Years of Education: N/A   Occupational History  . Not on file.   Social History Main Topics  . Smoking status: Former Smoker    Quit date: 03/11/2011  . Smokeless tobacco: Current User  . Alcohol Use: No  . Drug Use: No  . Sexual Activity: Yes   Other Topics Concern  . Not on file   Social History Narrative  . No narrative on file       Review of Systems  Constitutional: Negative for fever, chills and unexpected weight change.  Gastrointestinal: Positive for abdominal pain and abdominal distention (at times when hurting - swells up. ). Negative for nausea, vomiting, diarrhea, constipation, blood in stool and anal bleeding.  Genitourinary: Positive for frequency (always has had). Negative  for dysuria (occasionally  - once this week. ), urgency, hematuria, vaginal discharge and difficulty urinating.  Skin: Negative for rash.       Objective:   Physical Exam  Vitals reviewed. Constitutional: She is oriented to person, place, and time. She appears well-developed and well-nourished.  HENT:  Head: Normocephalic and atraumatic.  Eyes: Conjunctivae and EOM are normal. Pupils are equal, round, and reactive to light.  Neck: Carotid bruit is not present.  Cardiovascular: Normal rate, regular rhythm, normal heart sounds and intact distal pulses.   Pulmonary/Chest: Effort normal and breath sounds normal.  Abdominal: Soft. Bowel sounds are normal. She exhibits no distension, no pulsatile midline mass and no mass. There is  generalized tenderness (generalized, mild ttp - most upper left greater than mid epigastric greater than suprapubic. ). There is no rebound, no guarding and no CVA tenderness.  Obese/protuberant abdomen.   Neurological: She is alert and oriented to person, place, and time.  Skin: Skin is warm and dry.  Psychiatric: She has a normal mood and affect. Her behavior is normal.   Filed Vitals:   05/29/13 1822  BP: 106/74  Pulse: 88  Temp: 98.6 F (37 C)  TempSrc: Oral  Resp: 16  Height: 5' 1.5" (1.562 m)  Weight: 209 lb (94.802 kg)  SpO2: 99%    Results for orders placed in visit on 05/29/13  POCT CBC      Result Value Ref Range   WBC 5.3  4.6 - 10.2 K/uL   Lymph, poc 2.0  0.6 - 3.4   POC LYMPH PERCENT 37.1  10 - 50 %L   MID (cbc) 0.3  0 - 0.9   POC MID % 5.2  0 - 12 %M   POC Granulocyte 3.0  2 - 6.9   Granulocyte percent 56.7  37 - 80 %G   RBC 4.26  4.04 - 5.48 M/uL   Hemoglobin 13.7  12.2 - 16.2 g/dL   HCT, POC 16.1  09.6 - 47.9 %   MCV 99.7 (*) 80 - 97 fL   MCH, POC 32.2 (*) 27 - 31.2 pg   MCHC 32.2  31.8 - 35.4 g/dL   RDW, POC 04.5     Platelet Count, POC 416  142 - 424 K/uL   MPV 7.8  0 - 99.8 fL  POCT UA - MICROSCOPIC ONLY      Result Value Ref Range   WBC, Ur, HPF, POC 0-2     RBC, urine, microscopic 4-20     Bacteria, U Microscopic Trace     Mucus, UA neg     Epithelial cells, urine per micros 0-4     Crystals, Ur, HPF, POC neg     Casts, Ur, LPF, POC neg     Yeast, UA neg    POCT URINALYSIS DIPSTICK      Result Value Ref Range   Color, UA yellow     Clarity, UA clear     Glucose, UA neg     Bilirubin, UA neg     Ketones, UA neg     Spec Grav, UA 1.010     Blood, UA mod     pH, UA 7.0     Protein, UA neg     Urobilinogen, UA 0.2     Nitrite, UA neg     Leukocytes, UA Negative    POCT URINE PREGNANCY      Result Value Ref Range   Preg Test, Ur Negative  Assessment & Plan:   [name] is a 43 y.o. female Abdominal pain of unknown  etiology - Plan: POCT CBC, Comprehensive metabolic panel, Lipase, HELICOBACTER PYLORI  ANTIBODY, IGM, DISCONTINUED: omeprazole (PRILOSEC) 20 MG capsule  Abdominal distention - Plan: POCT CBC, Comprehensive metabolic panel, Lipase, HELICOBACTER PYLORI  ANTIBODY, IGM  Heartburn - Plan: Comprehensive metabolic panel, Lipase, DISCONTINUED: omeprazole (PRILOSEC) 20 MG capsule  Urine frequency - Plan: POCT UA - Microscopic Only, POCT urinalysis dipstick, POCT urine pregnancy, HELICOBACTER PYLORI  ANTIBODY, IGM  Hematuria, microscopic - Plan: Ambulatory referral to Urology, Urine culture  Lung nodule seen on imaging study  History of tobacco use - Plan: Ambulatory referral to Urology  Generalized abdominal pain - Plan: DISCONTINUED: omeprazole (PRILOSEC) 20 MG capsule   Upper abd pain - recurrent, urinary frequency. Reassuring CBC. Hematuria noted on U/a but doubt infection. Urine cx ordered. Noted persistent microscopic hematuria - refer to urology for eval. Check CMP, H pylori, lipase, and initial trial of PPI QD for possible gastritis/gerd. May need GI eval depending on results of labs and sx care.   Prior small lung nodule - repeat chest CT ordered.   rtc precautions discussed.  Meds ordered this encounter  Medications  . DISCONTD: omeprazole (PRILOSEC) 20 MG capsule    Sig: Take 1 capsule (20 mg total) by mouth daily.    Dispense:  30 capsule    Refill:  1   Patient Instructions  You should receive a call or letter about your lab results within the next week to 10 days.  We will refer you to urologist for blood in urine and check into the cat scan of your chest for the previous lung nodule.  Depending on lab results, you may need to see Dr. Christella Hartigan (gastroenterologist) to look at other causes of abdominal pain.  Start omeprazole once per day.  Return to the clinic or go to the nearest emergency room if any of your symptoms worsen or new symptoms occur.  Abdominal Pain, Adult Many  things can cause abdominal pain. Usually, abdominal pain is not caused by a disease and will improve without treatment. It can often be observed and treated at home. Your health care provider will do a physical exam and possibly order blood tests and X-rays to help determine the seriousness of your pain. However, in many cases, more time must pass before a clear cause of the pain can be found. Before that point, your health care provider may not know if you need more testing or further treatment. HOME CARE INSTRUCTIONS  Monitor your abdominal pain for any changes. The following actions may help to alleviate any discomfort you are experiencing:  Only take over-the-counter or prescription medicines as directed by your health care provider.  Do not take laxatives unless directed to do so by your health care provider.  Try a clear liquid diet (broth, tea, or water) as directed by your health care provider. Slowly move to a bland diet as tolerated. SEEK MEDICAL CARE IF:  You have unexplained abdominal pain.  You have abdominal pain associated with nausea or diarrhea.  You have pain when you urinate or have a bowel movement.  You experience abdominal pain that wakes you in the night.  You have abdominal pain that is worsened or improved by eating food.  You have abdominal pain that is worsened with eating fatty foods. SEEK IMMEDIATE MEDICAL CARE IF:   Your pain does not go away within 2 hours.  You have a fever.  You keep throwing up (vomiting).  Your pain is felt only in portions of the abdomen, such as the right side or the left lower portion of the abdomen.  You pass bloody or black tarry stools. MAKE SURE YOU:  Understand these instructions.   Will watch your condition.   Will get help right away if you are not doing well or get worse.  Document Released: 12/19/2004 Document Revised: 12/30/2012 Document Reviewed: 11/18/2012 [facility] Patient Information 2014 [place],  [place]. Hematuria, Adult Hematuria is blood in your urine. It can be caused by a bladder infection, kidney infection, prostate infection, kidney stone, or cancer of your urinary tract. Infections can usually be treated with medicine, and a kidney stone usually will pass through your urine. If neither of these is the cause of your hematuria, further workup to find out the reason may be needed. It is very important that you tell your health care provider about any blood you see in your urine, even if the blood stops without treatment or happens without causing pain. Blood in your urine that happens and then stops and then happens again can be a symptom of a very serious condition. Also, pain is not a symptom in the initial stages of many urinary cancers. HOME CARE INSTRUCTIONS   Drink lots of fluid, 3 4 quarts a day. If you have been diagnosed with an infection, cranberry juice is especially recommended, in addition to large amounts of water.  Avoid caffeine, tea, and carbonated beverages, because they tend to irritate the bladder.  Avoid alcohol because it may irritate the prostate.  Only take over-the-counter or prescription medicines for pain, discomfort, or fever as directed by your health care provider.  If you have been diagnosed with a kidney stone, follow your health care provider's instructions regarding straining your urine to catch the stone.  Empty your bladder often. Avoid holding urine for long periods of time.  After a bowel movement, women should cleanse front to back. Use each tissue only once.  Empty your bladder before and after sexual intercourse if you are a female. SEEK MEDICAL CARE IF: You develop back pain, fever, a feeling of sickness in your stomach (nausea), or vomiting or if your symptoms are not better in 3 days. Return sooner if you are getting worse. SEEK IMMEDIATE MEDICAL CARE IF:   You have a persistent fever, with a temperature of 101.67F (38.8C) or  greater.  You develop severe vomiting and are unable to keep the medicine down.  You develop severe back or abdominal pain despite taking your medicines.  You begin passing a large amount of blood or clots in your urine.  You feel extremely weak or faint, or you pass out. MAKE SURE YOU:   Understand these instructions.  Will watch your condition.  Will get help right away if you are not doing well or get worse. Document Released: 03/11/2005 Document Revised: 12/30/2012 Document Reviewed: 11/09/2012 [facility] Patient Information 2014 [place], [place].

## 2013-05-29 NOTE — Patient Instructions (Addendum)
You should receive a call or letter about your lab results within the next week to 10 days.  We will refer you to urologist for blood in urine and check into the cat scan of your chest for the previous lung nodule.  Depending on lab results, you may need to see Dr. Christella HartiganJacobs (gastroenterologist) to look at other causes of abdominal pain.  Start omeprazole once per day.  Return to the clinic or go to the nearest emergency room if any of your symptoms worsen or new symptoms occur.  Abdominal Pain, Adult Many things can cause abdominal pain. Usually, abdominal pain is not caused by a disease and will improve without treatment. It can often be observed and treated at home. Your health care provider will do a physical exam and possibly order blood tests and X-rays to help determine the seriousness of your pain. However, in many cases, more time must pass before a clear cause of the pain can be found. Before that point, your health care provider may not know if you need more testing or further treatment. HOME CARE INSTRUCTIONS  Monitor your abdominal pain for any changes. The following actions may help to alleviate any discomfort you are experiencing:  Only take over-the-counter or prescription medicines as directed by your health care provider.  Do not take laxatives unless directed to do so by your health care provider.  Try a clear liquid diet (broth, tea, or water) as directed by your health care provider. Slowly move to a bland diet as tolerated. SEEK MEDICAL CARE IF:  You have unexplained abdominal pain.  You have abdominal pain associated with nausea or diarrhea.  You have pain when you urinate or have a bowel movement.  You experience abdominal pain that wakes you in the night.  You have abdominal pain that is worsened or improved by eating food.  You have abdominal pain that is worsened with eating fatty foods. SEEK IMMEDIATE MEDICAL CARE IF:   Your pain does not go away within 2  hours.  You have a fever.  You keep throwing up (vomiting).  Your pain is felt only in portions of the abdomen, such as the right side or the left lower portion of the abdomen.  You pass bloody or black tarry stools. MAKE SURE YOU:  Understand these instructions.   Will watch your condition.   Will get help right away if you are not doing well or get worse.  Document Released: 12/19/2004 Document Revised: 12/30/2012 Document Reviewed: 11/18/2012 [facility] Patient Information 2014 [place]. Hematuria, Adult Hematuria is blood in your urine. It can be caused by a bladder infection, kidney infection, prostate infection, kidney stone, or cancer of your urinary tract. Infections can usually be treated with medicine, and a kidney stone usually will pass through your urine. If neither of these is the cause of your hematuria, further workup to find out the reason may be needed. It is very important that you tell your health care provider about any blood you see in your urine, even if the blood stops without treatment or happens without causing pain. Blood in your urine that happens and then stops and then happens again can be a symptom of a very serious condition. Also, pain is not a symptom in the initial stages of many urinary cancers. HOME CARE INSTRUCTIONS   Drink lots of fluid, 3 4 quarts a day. If you have been diagnosed with an infection, cranberry juice is especially recommended, in addition to large amounts of  water.  Avoid caffeine, tea, and carbonated beverages, because they tend to irritate the bladder.  Avoid alcohol because it may irritate the prostate.  Only take over-the-counter or prescription medicines for pain, discomfort, or fever as directed by your health care provider.  If you have been diagnosed with a kidney stone, follow your health care provider's instructions regarding straining your urine to catch the stone.  Empty your bladder often. Avoid holding urine  for long periods of time.  After a bowel movement, women should cleanse front to back. Use each tissue only once.  Empty your bladder before and after sexual intercourse if you are a female. SEEK MEDICAL CARE IF: You develop back pain, fever, a feeling of sickness in your stomach (nausea), or vomiting or if your symptoms are not better in 3 days. Return sooner if you are getting worse. SEEK IMMEDIATE MEDICAL CARE IF:   You have a persistent fever, with a temperature of 101.51F (38.8C) or greater.  You develop severe vomiting and are unable to keep the medicine down.  You develop severe back or abdominal pain despite taking your medicines.  You begin passing a large amount of blood or clots in your urine.  You feel extremely weak or faint, or you pass out. MAKE SURE YOU:   Understand these instructions.  Will watch your condition.  Will get help right away if you are not doing well or get worse. Document Released: 03/11/2005 Document Revised: 12/30/2012 Document Reviewed: 11/09/2012 [facility] Patient Information 2014 [place], [place].

## 2013-05-30 ENCOUNTER — Other Ambulatory Visit: Payer: Self-pay

## 2013-05-30 ENCOUNTER — Emergency Department (HOSPITAL_COMMUNITY): Payer: BC Managed Care – PPO

## 2013-05-30 ENCOUNTER — Encounter (HOSPITAL_COMMUNITY): Payer: Self-pay | Admitting: Emergency Medicine

## 2013-05-30 ENCOUNTER — Emergency Department (HOSPITAL_COMMUNITY)
Admission: EM | Admit: 2013-05-30 | Discharge: 2013-05-30 | Disposition: A | Discharge status: Home / Self Care | Payer: BC Managed Care – PPO | Attending: Emergency Medicine | Admitting: Emergency Medicine

## 2013-05-30 DIAGNOSIS — Z87891 Personal history of nicotine dependence: Secondary | ICD-10-CM | POA: Insufficient documentation

## 2013-05-30 DIAGNOSIS — Z9851 Tubal ligation status: Secondary | ICD-10-CM | POA: Insufficient documentation

## 2013-05-30 DIAGNOSIS — R109 Unspecified abdominal pain: Secondary | ICD-10-CM

## 2013-05-30 DIAGNOSIS — E669 Obesity, unspecified: Secondary | ICD-10-CM | POA: Insufficient documentation

## 2013-05-30 DIAGNOSIS — Z79899 Other long term (current) drug therapy: Secondary | ICD-10-CM | POA: Insufficient documentation

## 2013-05-30 DIAGNOSIS — R638 Other symptoms and signs concerning food and fluid intake: Secondary | ICD-10-CM | POA: Insufficient documentation

## 2013-05-30 DIAGNOSIS — R319 Hematuria, unspecified: Secondary | ICD-10-CM

## 2013-05-30 DIAGNOSIS — R1012 Left upper quadrant pain: Secondary | ICD-10-CM | POA: Insufficient documentation

## 2013-05-30 DIAGNOSIS — Z3202 Encounter for pregnancy test, result negative: Secondary | ICD-10-CM | POA: Insufficient documentation

## 2013-05-30 DIAGNOSIS — Z9089 Acquired absence of other organs: Secondary | ICD-10-CM | POA: Insufficient documentation

## 2013-05-30 LAB — I-STAT TROPONIN, ED: Troponin i, poc: 0 ng/mL (ref 0.00–0.08)

## 2013-05-30 LAB — COMPREHENSIVE METABOLIC PANEL
ALBUMIN: 3 g/dL — AB (ref 3.5–5.2)
ALT: 28 U/L (ref 0–35)
ALT: 33 U/L (ref 0–35)
AST: 39 U/L — AB (ref 0–37)
AST: 36 U/L (ref 0–37)
Albumin: 3.8 g/dL (ref 3.5–5.2)
Alkaline Phosphatase: 68 U/L (ref 39–117)
Alkaline Phosphatase: 74 U/L (ref 39–117)
BILIRUBIN TOTAL: 0.2 mg/dL (ref 0.2–1.2)
BUN: 7 mg/dL (ref 6–23)
BUN: 11 mg/dL (ref 6–23)
CHLORIDE: 104 meq/L (ref 96–112)
CHLORIDE: 101 meq/L (ref 96–112)
CO2: 27 meq/L (ref 19–32)
CO2: 22 meq/L (ref 19–32)
Calcium: 8.1 mg/dL — ABNORMAL LOW (ref 8.4–10.5)
Calcium: 8.5 mg/dL (ref 8.4–10.5)
Creat: 0.56 mg/dL (ref 0.50–1.10)
Creatinine, Ser: 0.48 mg/dL — ABNORMAL LOW (ref 0.50–1.10)
GFR calc Af Amer: >90 mL/min (ref >90)
GFR calc non Af Amer: >90 mL/min (ref >90)
GLUCOSE: 91 mg/dL (ref 70–99)
Glucose, Bld: 70 mg/dL (ref 70–99)
POTASSIUM: 3.6 meq/L — AB (ref 3.7–5.3)
Potassium: 3.9 mEq/L (ref 3.5–5.3)
SODIUM: 140 meq/L (ref 135–145)
SODIUM: 139 meq/L (ref 137–147)
TOTAL PROTEIN: 7 g/dL (ref 6.0–8.3)
TOTAL PROTEIN: 7.1 g/dL (ref 6.0–8.3)
Total Bilirubin: <0.2 mg/dL — ABNORMAL LOW (ref 0.3–1.2)

## 2013-05-30 LAB — URINE MICROSCOPIC-ADD ON

## 2013-05-30 LAB — LIPASE: LIPASE: 23 U/L (ref 0–75)

## 2013-05-30 LAB — URINALYSIS, ROUTINE W REFLEX MICROSCOPIC
BILIRUBIN URINE: NEGATIVE
Glucose, UA: NEGATIVE mg/dL
Ketones, ur: NEGATIVE mg/dL
Nitrite: NEGATIVE
PROTEIN: NEGATIVE mg/dL
Specific Gravity, Urine: 1.01 (ref 1.005–1.030)
Urobilinogen, UA: 0.2 mg/dL (ref 0.0–1.0)
pH: 6 (ref 5.0–8.0)

## 2013-05-30 LAB — CBC WITH DIFFERENTIAL/PLATELET
Basophils Absolute: 0 10*3/uL (ref 0.0–0.1)
Basophils Relative: 1 % (ref 0–1)
EOS ABS: 0.1 10*3/uL (ref 0.0–0.7)
Eosinophils Relative: 2 % (ref 0–5)
HCT: 37.7 % (ref 36.0–46.0)
HEMOGLOBIN: 13.5 g/dL (ref 12.0–15.0)
LYMPHS ABS: 2.2 10*3/uL (ref 0.7–4.0)
LYMPHS PCT: 34 % (ref 12–46)
MCH: 33.9 pg (ref 26.0–34.0)
MCHC: 35.8 g/dL (ref 30.0–36.0)
MCV: 94.7 fL (ref 78.0–100.0)
MONOS PCT: 6 % (ref 3–12)
Monocytes Absolute: 0.4 10*3/uL (ref 0.1–1.0)
NEUTROS ABS: 3.8 10*3/uL (ref 1.7–7.7)
NEUTROS PCT: 58 % (ref 43–77)
PLATELETS: 364 10*3/uL (ref 150–400)
RBC: 3.98 MIL/uL (ref 3.87–5.11)
RDW: 11.6 % (ref 11.5–15.5)
WBC: 6.5 10*3/uL (ref 4.0–10.5)

## 2013-05-30 LAB — LIPASE, BLOOD: LIPASE: 31 U/L (ref 11–59)

## 2013-05-30 LAB — PREGNANCY, URINE: PREG TEST UR: NEGATIVE

## 2013-05-30 MED ORDER — CIPROFLOXACIN HCL 500 MG PO TABS: 500.0000 mg | ORAL_TABLET | Freq: Two times a day (BID) | ORAL | Status: DC

## 2013-05-30 MED ORDER — SODIUM CHLORIDE 0.9 % IV BOLUS (SEPSIS)
1000.0000 mL | Freq: Once | INTRAVENOUS | Status: AC
  Administered 2013-05-30: 1000 mL via INTRAVENOUS

## 2013-05-30 MED ORDER — ONDANSETRON HCL 4 MG/2ML IJ SOLN
4.0000 mg | Freq: Once | INTRAMUSCULAR | Status: AC
  Administered 2013-05-30: 4 mg via INTRAVENOUS
  Filled 2013-05-30: qty 2

## 2013-05-30 MED ORDER — MORPHINE SULFATE 4 MG/ML IJ SOLN
4.0000 mg | Freq: Once | INTRAMUSCULAR | Status: AC
  Administered 2013-05-30: 4 mg via INTRAVENOUS
  Filled 2013-05-30: qty 1

## 2013-05-30 MED ORDER — IOHEXOL 300 MG/ML  SOLN
50.0000 mL | Freq: Once | INTRAMUSCULAR | Status: AC | PRN
  Administered 2013-05-30: 50 mL via ORAL

## 2013-05-30 MED ORDER — IOHEXOL 300 MG/ML  SOLN
100.0000 mL | Freq: Once | INTRAMUSCULAR | Status: AC | PRN
  Administered 2013-05-30: 100 mL via INTRAVENOUS

## 2013-05-30 MED ORDER — FENTANYL CITRATE 0.05 MG/ML IJ SOLN
100.0000 ug | Freq: Once | INTRAMUSCULAR | Status: AC
  Administered 2013-05-30: 100 ug via INTRAVENOUS
  Filled 2013-05-30: qty 2

## 2013-05-30 MED ORDER — ONDANSETRON HCL 4 MG PO TABS: 4.0000 mg | ORAL_TABLET | Freq: Four times a day (QID) | ORAL | Status: DC

## 2013-05-30 NOTE — Discharge Instructions (Signed)
Abdominal Pain, Adult  Many things can cause abdominal pain. Usually, abdominal pain is not caused by a disease and will improve without treatment. It can often be observed and treated at home. Your health care provider will do a physical exam and possibly order blood tests and X-rays to help determine the seriousness of your pain. However, in many cases, more time must pass before a clear cause of the pain can be found. Before that point, your health care provider may not know if you need more testing or further treatment.  HOME CARE INSTRUCTIONS   Monitor your abdominal pain for any changes. The following actions may help to alleviate any discomfort you are experiencing:   Only take over-the-counter or prescription medicines as directed by your health care provider.   Do not take laxatives unless directed to do so by your health care provider.   Try a clear liquid diet (broth, tea, or water) as directed by your health care provider. Slowly move to a bland diet as tolerated.  SEEK MEDICAL CARE IF:   You have unexplained abdominal pain.   You have abdominal pain associated with nausea or diarrhea.   You have pain when you urinate or have a bowel movement.   You experience abdominal pain that wakes you in the night.   You have abdominal pain that is worsened or improved by eating food.   You have abdominal pain that is worsened with eating fatty foods.  SEEK IMMEDIATE MEDICAL CARE IF:    Your pain does not go away within 2 hours.   You have a fever.   You keep throwing up (vomiting).   Your pain is felt only in portions of the abdomen, such as the right side or the left lower portion of the abdomen.   You pass bloody or black tarry stools.  MAKE SURE YOU:   Understand these instructions.    Will watch your condition.    Will get help right away if you are not doing well or get worse.   Document Released: 12/19/2004 Document Revised: 12/30/2012 Document Reviewed: 11/18/2012  [employer] Patient  Information 2014 [employer].          Hematuria, Adult  Hematuria is blood in your urine. It can be caused by a bladder infection, kidney infection, prostate infection, kidney stone, or cancer of your urinary tract. Infections can usually be treated with medicine, and a kidney stone usually will pass through your urine. If neither of these is the cause of your hematuria, further workup to find out the reason may be needed.  It is very important that you tell your health care provider about any blood you see in your urine, even if the blood stops without treatment or happens without causing pain. Blood in your urine that happens and then stops and then happens again can be a symptom of a very serious condition. Also, pain is not a symptom in the initial stages of many urinary cancers.  HOME CARE INSTRUCTIONS    Drink lots of fluid, 3 4 quarts a day. If you have been diagnosed with an infection, cranberry juice is especially recommended, in addition to large amounts of water.   Avoid caffeine, tea, and carbonated beverages, because they tend to irritate the bladder.   Avoid alcohol because it may irritate the prostate.   Only take over-the-counter or prescription medicines for pain, discomfort, or fever as directed by your health care provider.   If you have been diagnosed with a   kidney stone, follow your health care provider's instructions regarding straining your urine to catch the stone.   Empty your bladder often. Avoid holding urine for long periods of time.   After a bowel movement, women should cleanse front to back. Use each tissue only once.   Empty your bladder before and after sexual intercourse if you are a female.  SEEK MEDICAL CARE IF:  You develop back pain, fever, a feeling of sickness in your stomach (nausea), or vomiting or if your symptoms are not better in 3 days. Return sooner if you are getting worse.  SEEK IMMEDIATE MEDICAL CARE IF:    You have a persistent fever, with a temperature  of 101.8F (38.8C) or greater.   You develop severe vomiting and are unable to keep the medicine down.   You develop severe back or abdominal pain despite taking your medicines.   You begin passing a large amount of blood or clots in your urine.   You feel extremely weak or faint, or you pass out.  MAKE SURE YOU:    Understand these instructions.   Will watch your condition.   Will get help right away if you are not doing well or get worse.  Document Released: 03/11/2005 Document Revised: 12/30/2012 Document Reviewed: 11/09/2012  [employer] Patient Information 2014 [employer].

## 2013-05-30 NOTE — ED Notes (Signed)
Pt is back in the room.

## 2013-05-30 NOTE — ED Provider Notes (Signed)
Medical screening examination/treatment/procedure(s) were performed by non-physician practitioner and as supervising physician I was immediately available for consultation/collaboration.   EKG Interpretation None        Shynice Sigel S Zamya Culhane, MD 05/30/13 1530 

## 2013-05-30 NOTE — ED Notes (Signed)
Pt states that she has CP that wraps under both breast. Pt states that it is intermittant and she has been having it for a long time. Pt states several times over the last 2 weeks. Pt denies if the pain gets worse or better with eating.

## 2013-05-30 NOTE — ED Provider Notes (Signed)
CSN: [id number]     Arrival date & time 05/30/13  4098 History   First MD Initiated Contact with Patient 05/30/13 [phone]     Chief Complaint  Patient presents with  . Chest Pain     (Consider location/radiation/quality/duration/timing/severity/associated sxs/prior Treatment) HPI  Pt does not speak much english, she is from [place]. Interpretor (family member) assisted with interview.  Yaslene Willy is a 43 y.o.female without any significant PMH presents to the ER with complaints of abdominal pains for the last few months that has worsened in the past few days. She decided to come in this morning because she could no longer tolerate the pain. She denies having any pains in her chest and clearly points to her bilateral upper abdomen and then traces down her bilateral flank.  She reports it as colicky, intermittent and cramping at first, now its must stronger and constant. She denies having any vomiting or diarrhea. She has had some loss of appetite.She reports no fevers, weakness, confusion, CP, SOB, dysuria, vaginal discharge or vaginal bleeding.    Denies constipation.  S/p lap cholecystectomy in 2012.   LMP 2 weeks ago - normal.     Past Medical History  Diagnosis Date  . No pertinent past medical history   . Obesity (BMI 30-39.9)    Past Surgical History  Procedure Laterality Date  . Tubal ligation    . Cholecystectomy  02/20/2011    Procedure: LAPAROSCOPIC CHOLECYSTECTOMY WITH INTRAOPERATIVE CHOLANGIOGRAM;  Surgeon: Kandis Cocking, MD;  Location: [facility] OR;  Service: General;  Laterality: N/A;   Family History  Problem Relation Age of Onset  . Diabetes Paternal Grandmother   . Diabetes Paternal Grandfather   . Diabetes Maternal Grandmother   . Diabetes Maternal Grandfather   . Diabetes Mother   . Heart disease Mother   . Diabetes Father   . Heart disease Father   . Colon cancer Neg Hx   . Rectal cancer Neg Hx   . Stomach cancer Neg Hx   . Esophageal cancer Neg Hx     History  Substance Use Topics  . Smoking status: Former Smoker    Quit date: 03/11/2011  . Smokeless tobacco: Current User  . Alcohol Use: No   OB History   Grav Para Term Preterm Abortions TAB SAB Ect Mult Living                 Review of Systems  The patient denies anorexia, fever, weight loss,, vision loss, decreased hearing, hoarseness, chest pain, syncope, dyspnea on exertion, peripheral edema, balance deficits, hemoptysis, abdominal pain, melena, hematochezia, severe indigestion/heartburn, hematuria, incontinence, genital sores, muscle weakness, suspicious skin lesions, transient blindness, difficulty walking, depression, unusual weight change, abnormal bleeding, enlarged lymph nodes, angioedema, and breast masses.   Allergies  Review of patient's allergies indicates no known allergies.  Home Medications   Current Outpatient Rx  Name  Route  Sig  Dispense  Refill  . ciprofloxacin (CIPRO) 500 MG tablet   Oral   Take 1 tablet (500 mg total) by mouth 2 (two) times daily.   28 tablet   0   . omeprazole (PRILOSEC) 20 MG capsule   Oral   Take 20 mg by mouth daily.         . ondansetron (ZOFRAN) 4 MG tablet   Oral   Take 1 tablet (4 mg total) by mouth every 6 (six) hours.   12 tablet   0    BP 115/64  Pulse 73  Temp(Src) 97.9 F (36.6 C) (Oral)  Resp 16  Ht 5\' 1"  (1.549 m)  Wt 212 lb 9 oz (96.418 kg)  BMI 40.18 kg/m2  SpO2 100%  LMP 05/17/2013 Physical Exam  Nursing note and vitals reviewed. Constitutional: She appears well-developed and well-nourished. No distress.  HENT:  Head: Normocephalic and atraumatic.  Eyes: Pupils are equal, round, and reactive to light.  Neck: Normal range of motion. Neck supple.  Cardiovascular: Normal rate and regular rhythm.   Pulmonary/Chest: Effort normal.  Abdominal: Soft. Bowel sounds are normal. She exhibits no ascites. There is tenderness (diffuse tenderness, worse in the LUQ). There is no rigidity, no rebound and no  guarding.  Neurological: She is alert.  Skin: Skin is warm and dry.    ED Course  Procedures (including critical care time) Labs Review Labs Reviewed  COMPREHENSIVE METABOLIC PANEL - Abnormal; Notable for the following:    Potassium 3.6 (*)    Creatinine, Ser 0.48 (*)    Albumin 3.0 (*)    Total Bilirubin <0.2 (*)    All other components within normal limits  URINALYSIS, ROUTINE W REFLEX MICROSCOPIC - Abnormal; Notable for the following:    APPearance CLOUDY (*)    Hgb urine dipstick LARGE (*)    Leukocytes, UA TRACE (*)    All other components within normal limits  URINE MICROSCOPIC-ADD ON - Abnormal; Notable for the following:    Squamous Epithelial / LPF MANY (*)    Bacteria, UA FEW (*)    All other components within normal limits  CBC WITH DIFFERENTIAL  LIPASE, BLOOD  PREGNANCY, URINE  I-STAT TROPOININ, ED   Imaging Review Ct Abdomen Pelvis W Contrast  05/30/2013   CLINICAL DATA:  Epigastric pain.  EXAM: CT ABDOMEN AND PELVIS WITH CONTRAST  TECHNIQUE: Multidetector CT imaging of the abdomen and pelvis was performed using the standard protocol following bolus administration of intravenous contrast.  CONTRAST:  100mL OMNIPAQUE IOHEXOL 300 MG/ML  SOLN  COMPARISON:  02/14/2012  FINDINGS: Stable punctate nodules in the right lower lobe. Largest nodule measures 4 mm on sequence 3, image 9 and unchanged since 02/14/2012. Otherwise, the lung bases are clear.  Negative for free air. Slightly decreased attenuation of the liver without focal abnormality. The gallbladder has been removed. The portal venous system is patent. There is focal fat near the falciform ligament. Normal appearance of the pancreas, spleen, adrenal glands and kidneys. There is no gross abnormality to the uterus or adnexa tissues. No significant free fluid or lymphadenopathy. There is fluid in the urinary bladder. There is mild wall thickening along the right anterior bladder. Stable right pericolonic lymph node on  sequence 2, image 48. Normal appearance of the appendix. Normal appearance of the small bowel. No evidence for bowel dilatation or obstruction. Small periumbilical hernia containing fat.  No acute bone abnormality.  IMPRESSION: There is mild asymmetric wall thickening along the anterior urinary bladder. A subtle mucosal lesion cannot be excluded and consider non-emergent urology consultation.  Small pulmonary nodules at the right lung base. Minimal change since 2013.   Electronically Signed   By: Richarda OverlieAdam  Henn M.D.   On: 05/30/2013 09:23     EKG Interpretation None      MDM   Final diagnoses:  Hematuria  Abdominal pain    Patient treated with fluids and IV pain medicatiosn in the ER. She reports that this helped her pain significantly. She says the [facility] her up with an appointment for her stomach but  she does not know if it is a urologist of GI. I feel she could benefit from a Urologist given the increased thickness of her bladder wall and blood in her urine.  UC prescribed pain medications. I will add on a round of abx and nausea medication. Will do Urology referral for hematuria. Patients is agreeable and reliable  42 y.o.Elfrieda Sienkiewicz's evaluation in the Emergency Department is complete. It has been determined that no acute conditions requiring further emergency intervention are present at this time. The patient/guardian have been advised of the diagnosis and plan. We have discussed signs and symptoms that warrant return to the ED, such as changes or worsening in symptoms.  Vital signs are stable at discharge. Filed Vitals:   05/30/13 0950  BP: 115/64  Pulse:   Temp: 97.9 F (36.6 C)  Resp: 16    Patient/guardian has voiced understanding and agreed to follow-up with the PCP or specialist.     Dorthula Matas, PA-C 05/30/13 1507

## 2013-05-31 LAB — URINE CULTURE: Colony Count: 15000

## 2013-05-31 LAB — HELICOBACTER PYLORI  ANTIBODY, IGM: Helicobacter pylori, IgM: 11.9 U/mL — ABNORMAL HIGH (ref <9.0)

## 2014-01-23 ENCOUNTER — Emergency Department (HOSPITAL_COMMUNITY): Payer: BC Managed Care – PPO

## 2014-01-23 ENCOUNTER — Other Ambulatory Visit: Payer: Self-pay | Admitting: Emergency Medicine

## 2014-01-23 ENCOUNTER — Emergency Department (HOSPITAL_BASED_OUTPATIENT_CLINIC_OR_DEPARTMENT_OTHER)
Admission: EM | Admit: 2014-01-23 | Discharge: 2014-01-23 | Disposition: A | Discharge status: Home / Self Care | Payer: BC Managed Care – PPO | Attending: Emergency Medicine | Admitting: Emergency Medicine

## 2014-01-23 ENCOUNTER — Emergency Department (HOSPITAL_BASED_OUTPATIENT_CLINIC_OR_DEPARTMENT_OTHER): Payer: BC Managed Care – PPO

## 2014-01-23 ENCOUNTER — Encounter (HOSPITAL_COMMUNITY): Payer: Self-pay | Admitting: Family Medicine

## 2014-01-23 ENCOUNTER — Emergency Department (HOSPITAL_COMMUNITY)
Admission: EM | Admit: 2014-01-23 | Discharge: 2014-01-23 | Disposition: A | Discharge status: Home / Self Care | Payer: BC Managed Care – PPO | Source: Home / Self Care | Attending: Emergency Medicine | Admitting: Emergency Medicine

## 2014-01-23 DIAGNOSIS — Z9851 Tubal ligation status: Secondary | ICD-10-CM | POA: Insufficient documentation

## 2014-01-23 DIAGNOSIS — Z87891 Personal history of nicotine dependence: Secondary | ICD-10-CM | POA: Diagnosis not present

## 2014-01-23 DIAGNOSIS — Z6839 Body mass index (BMI) 39.0-39.9, adult: Secondary | ICD-10-CM

## 2014-01-23 DIAGNOSIS — R110 Nausea: Secondary | ICD-10-CM

## 2014-01-23 DIAGNOSIS — R0789 Other chest pain: Secondary | ICD-10-CM

## 2014-01-23 DIAGNOSIS — Z79899 Other long term (current) drug therapy: Secondary | ICD-10-CM

## 2014-01-23 DIAGNOSIS — R079 Chest pain, unspecified: Secondary | ICD-10-CM | POA: Insufficient documentation

## 2014-01-23 DIAGNOSIS — N390 Urinary tract infection, site not specified: Secondary | ICD-10-CM | POA: Insufficient documentation

## 2014-01-23 DIAGNOSIS — R109 Unspecified abdominal pain: Secondary | ICD-10-CM

## 2014-01-23 DIAGNOSIS — Z9049 Acquired absence of other specified parts of digestive tract: Secondary | ICD-10-CM | POA: Insufficient documentation

## 2014-01-23 DIAGNOSIS — E669 Obesity, unspecified: Secondary | ICD-10-CM

## 2014-01-23 DIAGNOSIS — R319 Hematuria, unspecified: Secondary | ICD-10-CM | POA: Diagnosis not present

## 2014-01-23 LAB — URINALYSIS, ROUTINE W REFLEX MICROSCOPIC
BILIRUBIN URINE: NEGATIVE
Glucose, UA: NEGATIVE mg/dL
Ketones, ur: NEGATIVE mg/dL
Nitrite: POSITIVE — AB
PROTEIN: NEGATIVE mg/dL
Specific Gravity, Urine: 1.003 — ABNORMAL LOW (ref 1.005–1.030)
Urobilinogen, UA: 0.2 mg/dL (ref 0.0–1.0)
pH: 7 (ref 5.0–8.0)

## 2014-01-23 LAB — CBC WITH DIFFERENTIAL/PLATELET
BASOS ABS: 0 10*3/uL (ref 0.0–0.1)
BASOS PCT: 1 % (ref 0–1)
EOS ABS: 0.1 10*3/uL (ref 0.0–0.7)
Eosinophils Relative: 1 % (ref 0–5)
HCT: 36.9 % (ref 36.0–46.0)
Hemoglobin: 12.6 g/dL (ref 12.0–15.0)
Lymphocytes Relative: 51 % — ABNORMAL HIGH (ref 12–46)
Lymphs Abs: 2.4 10*3/uL (ref 0.7–4.0)
MCH: 33.5 pg (ref 26.0–34.0)
MCHC: 34.1 g/dL (ref 30.0–36.0)
MCV: 98.1 fL (ref 78.0–100.0)
Monocytes Absolute: 0.3 10*3/uL (ref 0.1–1.0)
Monocytes Relative: 7 % (ref 3–12)
Neutro Abs: 1.9 10*3/uL (ref 1.7–7.7)
Neutrophils Relative %: 40 % — ABNORMAL LOW (ref 43–77)
PLATELETS: 285 10*3/uL (ref 150–400)
RBC: 3.76 MIL/uL — ABNORMAL LOW (ref 3.87–5.11)
RDW: 11.9 % (ref 11.5–15.5)
WBC: 4.7 10*3/uL (ref 4.0–10.5)

## 2014-01-23 LAB — COMPREHENSIVE METABOLIC PANEL
ALBUMIN: 2.9 g/dL — AB (ref 3.5–5.2)
ALK PHOS: 65 U/L (ref 39–117)
ALT: 14 U/L (ref 0–35)
ANION GAP: 13 (ref 5–15)
AST: 21 U/L (ref 0–37)
BUN: 8 mg/dL (ref 6–23)
CO2: 22 mEq/L (ref 19–32)
Calcium: 8.2 mg/dL — ABNORMAL LOW (ref 8.4–10.5)
Chloride: 104 mEq/L (ref 96–112)
Creatinine, Ser: 0.62 mg/dL (ref 0.50–1.10)
GFR calc Af Amer: >90 mL/min (ref >90)
GFR calc non Af Amer: >90 mL/min (ref >90)
Glucose, Bld: 88 mg/dL (ref 70–99)
Potassium: 4 mEq/L (ref 3.7–5.3)
Sodium: 139 mEq/L (ref 137–147)
TOTAL PROTEIN: 6.7 g/dL (ref 6.0–8.3)
Total Bilirubin: 0.3 mg/dL (ref 0.3–1.2)

## 2014-01-23 LAB — URINE MICROSCOPIC-ADD ON

## 2014-01-23 LAB — PREGNANCY, URINE: Preg Test, Ur: NEGATIVE

## 2014-01-23 LAB — I-STAT TROPONIN, ED: Troponin i, poc: 0.01 ng/mL (ref 0.00–0.08)

## 2014-01-23 LAB — LIPASE, BLOOD: Lipase: 18 U/L (ref 11–59)

## 2014-01-23 MED ORDER — SODIUM CHLORIDE 0.9 % IV BOLUS (SEPSIS)
1000.0000 mL | Freq: Once | INTRAVENOUS | Status: AC
  Administered 2014-01-23: 1000 mL via INTRAVENOUS

## 2014-01-23 MED ORDER — HYDROCODONE-ACETAMINOPHEN 5-325 MG PO TABS
1.0000 | ORAL_TABLET | Freq: Once | ORAL | Status: AC
  Administered 2014-01-23: 1 via ORAL
  Filled 2014-01-23: qty 1

## 2014-01-23 MED ORDER — FENTANYL CITRATE 0.05 MG/ML IJ SOLN
50.0000 ug | Freq: Once | INTRAMUSCULAR | Status: AC
  Administered 2014-01-23: 50 ug via INTRAVENOUS
  Filled 2014-01-23: qty 2

## 2014-01-23 MED ORDER — CIPROFLOXACIN HCL 500 MG PO TABS: 500.0000 mg | ORAL_TABLET | Freq: Two times a day (BID) | ORAL | Status: DC

## 2014-01-23 MED ORDER — ONDANSETRON HCL 4 MG/2ML IJ SOLN
4.0000 mg | Freq: Once | INTRAMUSCULAR | Status: AC
  Administered 2014-01-23: 4 mg via INTRAVENOUS
  Filled 2014-01-23: qty 2

## 2014-01-23 MED ORDER — HYDROCODONE-ACETAMINOPHEN 5-325 MG PO TABS: 1.0000 | ORAL_TABLET | Freq: Four times a day (QID) | ORAL | Status: DC | PRN

## 2014-01-23 MED ORDER — FENTANYL CITRATE 0.05 MG/ML IJ SOLN
100.0000 ug | Freq: Once | INTRAMUSCULAR | Status: AC
  Administered 2014-01-23: 100 ug via INTRAVENOUS
  Filled 2014-01-23: qty 2

## 2014-01-23 MED ORDER — CIPROFLOXACIN HCL 500 MG PO TABS
500.0000 mg | ORAL_TABLET | Freq: Once | ORAL | Status: AC
  Administered 2014-01-23: 500 mg via ORAL
  Filled 2014-01-23: qty 1

## 2014-01-23 NOTE — ED Provider Notes (Signed)
CSN: [id number]     Arrival date & time 01/23/14  [id number] History   First MD Initiated Contact with Patient 01/23/14 0800     Chief Complaint  Patient presents with  . Flank Pain    Patient is a 43 y.o. female presenting with abdominal pain. The history is provided by the patient.  Abdominal Pain Pain location:  L flank Pain quality: aching   Pain radiation: bilateral chest. Pain severity:  Severe Onset quality:  Gradual Duration:  1 week Timing:  Constant Progression:  Worsening Chronicity:  New Context: previous surgery   Context: not trauma   Relieved by:  None tried Worsened by:  Nothing tried Ineffective treatments:  None tried Associated symptoms: chest pain, chills and nausea   Associated symptoms: no cough, no dysuria, no fever and no vomiting   Associated symptoms comment:  Urinary frequency Chest pain:    Quality:  Aching   Severity:  Mild   Onset quality:  Gradual   Duration:  1 week   Timing:  Constant   Progression:  Unchanged   Chronicity:  New Nausea:    Severity:  Severe   Onset quality:  Gradual   Duration:  1 week   Timing:  Constant   Progression:  Unchanged Risk factors: obesity   Risk factors: not elderly and not pregnant     Past Medical History  Diagnosis Date  . No pertinent past medical history   . Obesity (BMI 30-39.9)    Past Surgical History  Procedure Laterality Date  . Tubal ligation    . Cholecystectomy  02/20/2011    Procedure: LAPAROSCOPIC CHOLECYSTECTOMY WITH INTRAOPERATIVE CHOLANGIOGRAM;  Surgeon: Kandis Cockingavid H Newman, MD;  [facility];  Service: General;  Laterality: N/A;   Family History  Problem Relation Age of Onset  . Diabetes Paternal Grandmother   . Diabetes Paternal Grandfather   . Diabetes Maternal Grandmother   . Diabetes Maternal Grandfather   . Diabetes Mother   . Heart disease Mother   . Diabetes Father   . Heart disease Father   . Colon cancer Neg Hx   . Rectal cancer Neg Hx   . Stomach cancer Neg Hx   .  Esophageal cancer Neg Hx    History  Substance Use Topics  . Smoking status: Former Smoker    Quit date: 03/11/2011  . Smokeless tobacco: Current User  . Alcohol Use: No   OB History    No data available     Review of Systems  Constitutional: Positive for chills. Negative for fever.  Respiratory: Negative for cough.   Cardiovascular: Positive for chest pain.  Gastrointestinal: Positive for nausea and abdominal pain. Negative for vomiting.  Genitourinary: Positive for frequency and flank pain. Negative for dysuria and difficulty urinating.  Musculoskeletal: Negative for gait problem.  Skin: Negative for rash.  All other systems reviewed and are negative.   Allergies  Review of patient's allergies indicates no known allergies.  Home Medications   Prior to Admission medications   Medication Sig Start Date End Date Taking? Authorizing Provider  ciprofloxacin (CIPRO) 500 MG tablet Take 1 tablet (500 mg total) by mouth 2 (two) times daily. One po bid x 7 days 01/23/14   Hanley SeamenJohn L Molpus, MD  HYDROcodone-acetaminophen (NORCO/VICODIN) 5-325 MG per tablet Take 1-2 tablets by mouth every 6 (six) hours as needed (for pain). 01/23/14   John L Molpus, MD  omeprazole (PRILOSEC) 20 MG capsule Take 20 mg by mouth daily. 05/29/13  Shade FloodJeffrey R Greene, MD  ondansetron (ZOFRAN) 4 MG tablet Take 1 tablet (4 mg total) by mouth every 6 (six) hours. 05/30/13   Tiffany Irine SealG Greene, PA-C   BP 111/69 mmHg  Pulse 89  Temp(Src) 97.9 F (36.6 C)  Resp 18  SpO2 98%   Physical Exam  Constitutional: She is oriented to person, place, and time. She appears well-developed and well-nourished. She is cooperative. No distress.  Appears uncomfortable  HENT:  Head: Normocephalic and atraumatic.  Right Ear: External ear normal.  Left Ear: External ear normal.  Neck: Normal range of motion and phonation normal.  Cardiovascular: Normal rate and regular rhythm.   Pulses:      Radial pulses are 2+ on the right side, and  2+ on the left side.       Dorsalis pedis pulses are 2+ on the right side, and 2+ on the left side.  Pulmonary/Chest: Effort normal and breath sounds normal. No respiratory distress. She has no wheezes. She has no rales. She exhibits tenderness (anterior chest wall tenderness).  Abdominal: Soft. She exhibits no distension. There is no tenderness. There is CVA tenderness (left). There is no rebound and no guarding.  Neurological: She is alert and oriented to person, place, and time.  Skin: Skin is warm and dry. No rash noted. She is not diaphoretic.    ED Course  Procedures  Labs Review  Results for orders placed or performed during the hospital encounter of 01/23/14  CBC with Differential  Result Value Ref Range   WBC 4.7 4.0 - 10.5 K/uL   RBC 3.76 (L) 3.87 - 5.11 MIL/uL   Hemoglobin 12.6 12.0 - 15.0 g/dL   HCT 34.736.9 42.536.0 - 95.646.0 %   MCV 98.1 78.0 - 100.0 fL   MCH 33.5 26.0 - 34.0 pg   MCHC 34.1 30.0 - 36.0 g/dL   RDW 38.711.9 56.411.5 - 33.215.5 %   Platelets 285 150 - 400 K/uL   Neutrophils Relative % 40 (L) 43 - 77 %   Neutro Abs 1.9 1.7 - 7.7 K/uL   Lymphocytes Relative 51 (H) 12 - 46 %   Lymphs Abs 2.4 0.7 - 4.0 K/uL   Monocytes Relative 7 3 - 12 %   Monocytes Absolute 0.3 0.1 - 1.0 K/uL   Eosinophils Relative 1 0 - 5 %   Eosinophils Absolute 0.1 0.0 - 0.7 K/uL   Basophils Relative 1 0 - 1 %   Basophils Absolute 0.0 0.0 - 0.1 K/uL  Comprehensive metabolic panel  Result Value Ref Range   Sodium 139 137 - 147 mEq/L   Potassium 4.0 3.7 - 5.3 mEq/L   Chloride 104 96 - 112 mEq/L   CO2 22 19 - 32 mEq/L   Glucose, Bld 88 70 - 99 mg/dL   BUN 8 6 - 23 mg/dL   Creatinine, Ser 9.510.62 0.50 - 1.10 mg/dL   Calcium 8.2 (L) 8.4 - 10.5 mg/dL   Total Protein 6.7 6.0 - 8.3 g/dL   Albumin 2.9 (L) 3.5 - 5.2 g/dL   AST 21 0 - 37 U/L   ALT 14 0 - 35 U/L   Alkaline Phosphatase 65 39 - 117 U/L   Total Bilirubin 0.3 0.3 - 1.2 mg/dL   GFR calc non Af Amer >90 >90 mL/min   GFR calc Af Amer >90 >90 mL/min    Anion gap 13 5 - 15  Lipase, blood  Result Value Ref Range   Lipase 18 11 - 59 U/L  I-stat troponin, ED  Result Value Ref Range   Troponin i, poc 0.01 0.00 - 0.08 ng/mL   Comment 3           Labs from [facility] (in the Epic System) earlier this morning: Urine Pregnancy Test 01/23/14 02:50: Negative Urinalysis 01/23/14 02:50: Positive Nitrites, Trace Leukocytes, 3-6 WBCs, 7-10 RBCs, Few Bacteria, Few Squamous Epithelial Urine Culture Pending  Imaging Review Ct Abdomen Pelvis Wo Contrast  01/23/2014   CLINICAL DATA:  Left flank pain.  EXAM: CT ABDOMEN AND PELVIS WITHOUT CONTRAST  TECHNIQUE: Multidetector CT imaging of the abdomen and pelvis was performed following the standard protocol without IV contrast.  COMPARISON:  CT scan dated 05/30/2013  FINDINGS: Gallbladder has been removed. Biliary tree is otherwise normal. Liver, spleen, pancreas, adrenal glands, and kidneys are normal. The bowel is normal. Uterus and ovaries are normal. Bladder is normal. No osseous abnormality.  IMPRESSION: Benign appearing abdomen and pelvis.   Electronically Signed   By: Geanie Cooley M.D.   On: 01/23/2014 05:51    EKG Rate 78, NSR, PR 136, QTc 438, No ST elevation or depression  MDM   Final diagnoses:  Chest pain  Left-sided chest wall pain  Left flank pain  Urinary tract infection with hematuria, site unspecified   43 y.o. female with a history of obesity. Diagnosed with UTI earlier this morning at [facility]. Records reviewed. See above.   Very atypical left chest wall pain radiating to the left flank. Entire side of her left torso tender to palpation. Review of records show that this has been an on going issue since at least 11/2012. Concern for a bony abnormality such as a tumor given such long duration of persistent left rib pain. Further review of records showed that she has had no osseous abnormality noted on CT abd/pelvis which would show the lower ribs.   No concern for PE given duration of  symptoms, no tachycardia or hypoxia, no recent surgery, cancer, unilateral leg swelling.   Labs reviewed. Troponin negative.   9:58 AM Patient is feeling "much better" after IV fluids, pain control, anti-emetics; still with some left sided chest wall pain, tender to palpation; will give a dose of PO pain medication  I discussed the importance of following up with a PCP and resources were given to the patient to establish. Discussed the need to have a dedicated CT chest as an outpatient. She has prescriptions from [facility] for Cipro and Norco - encouraged her to fill them and take the antibiotic.   Strict return precautions discussed and given in writing. She was discharged home.   This case managed in conjunction with my attending, Dr. Jodi Mourning.     Maxine Glenn, MD 01/23/14 [phone]

## 2014-01-23 NOTE — ED Notes (Signed)
See downtime charting for prior Documentation

## 2014-01-23 NOTE — ED Notes (Signed)
Pt just seen at [facility]. sts still having pain. Pt given pain meds and dx with UTI.

## 2014-01-23 NOTE — ED Notes (Signed)
Provider at the bedside.  

## 2014-01-23 NOTE — Discharge Instructions (Signed)
Abdominal Pain, Women °Abdominal (stomach, pelvic, or belly) pain can be caused by many things. It is important to tell your doctor: °· The location of the pain. °· Does it come and go or is it present all the time? °· Are there things that start the pain (eating certain foods, exercise)? °· Are there other symptoms associated with the pain (fever, nausea, vomiting, diarrhea)? °All of this is helpful to know when trying to find the cause of the pain. °CAUSES  °· Stomach: virus or bacteria infection, or ulcer. °· Intestine: appendicitis (inflamed appendix), regional ileitis (Crohn's disease), ulcerative colitis (inflamed colon), irritable bowel syndrome, diverticulitis (inflamed diverticulum of the colon), or cancer of the stomach or intestine. °· Gallbladder disease or stones in the gallbladder. °· Kidney disease, kidney stones, or infection. °· Pancreas infection or cancer. °· Fibromyalgia (pain disorder). °· Diseases of the female organs: °· Uterus: fibroid (non-cancerous) tumors or infection. °· Fallopian tubes: infection or tubal pregnancy. °· Ovary: cysts or tumors. °· Pelvic adhesions (scar tissue). °· Endometriosis (uterus lining tissue growing in the pelvis and on the pelvic organs). °· Pelvic congestion syndrome (female organs filling up with blood just before the menstrual period). °· Pain with the menstrual period. °· Pain with ovulation (producing an egg). °· Pain with an IUD (intrauterine device, birth control) in the uterus. °· Cancer of the female organs. °· Functional pain (pain not caused by a disease, may improve without treatment). °· Psychological pain. °· Depression. °DIAGNOSIS  °Your doctor will decide the seriousness of your pain by doing an examination. °· Blood tests. °· X-rays. °· Ultrasound. °· CT scan (computed tomography, special type of X-ray). °· MRI (magnetic resonance imaging). °· Cultures, for infection. °· Barium enema (dye inserted in the large intestine, to better view it with  X-rays). °· Colonoscopy (looking in intestine with a lighted tube). °· Laparoscopy (minor surgery, looking in abdomen with a lighted tube). °· Major abdominal exploratory surgery (looking in abdomen with a large incision). °TREATMENT  °The treatment will depend on the cause of the pain.  °· Many cases can be observed and treated at home. °· Over-the-counter medicines recommended by your caregiver. °· Prescription medicine. °· Antibiotics, for infection. °· Birth control pills, for painful periods or for ovulation pain. °· Hormone treatment, for endometriosis. °· Nerve blocking injections. °· Physical therapy. °· Antidepressants. °· Counseling with a psychologist or psychiatrist. °· Minor or major surgery. °HOME CARE INSTRUCTIONS  °· Do not take laxatives, unless directed by your caregiver. °· Take over-the-counter pain medicine only if ordered by your caregiver. Do not take aspirin because it can cause an upset stomach or bleeding. °· Try a clear liquid diet (broth or water) as ordered by your caregiver. Slowly move to a bland diet, as tolerated, if the pain is related to the stomach or intestine. °· Have a thermometer and take your temperature several times a day, and record it. °· Bed rest and sleep, if it helps the pain. °· Avoid sexual intercourse, if it causes pain. °· Avoid stressful situations. °· Keep your follow-up appointments and tests, as your caregiver orders. °· If the pain does not go away with medicine or surgery, you may try: °· Acupuncture. °· Relaxation exercises (yoga, meditation). °· Group therapy. °· Counseling. °SEEK MEDICAL CARE IF:  °· You notice certain foods cause stomach pain. °· Your home care treatment is not helping your pain. °· You need stronger pain medicine. °· You want your IUD removed. °· You feel faint or   lightheaded. °· You develop nausea and vomiting. °· You develop a rash. °· You are having side effects or an allergy to your medicine. °SEEK IMMEDIATE MEDICAL CARE IF:  °· Your  pain does not go away or gets worse. °· You have a fever. °· Your pain is felt only in portions of the abdomen. The right side could possibly be appendicitis. The left lower portion of the abdomen could be colitis or diverticulitis. °· You are passing blood in your stools (bright red or black tarry stools, with or without vomiting). °· You have blood in your urine. °· You develop chills, with or without a fever. °· You pass out. °MAKE SURE YOU:  °· Understand these instructions. °· Will watch your condition. °· Will get help right away if you are not doing well or get worse. °Document Released: 01/06/2007 Document Revised: 07/26/2013 Document Reviewed: 01/26/2009 °Exit[employer]® Patient Information ©2015 [employer]. This information is not intended to replace advice given to you by your health care provider. Make sure you discuss any questions you have with your health care provider. ° °Chest Pain (Nonspecific) °It is often hard to give a specific diagnosis for the cause of chest pain. There is always a chance that your pain could be related to something serious, such as a heart attack or a blood clot in the lungs. You need to follow up with your health care provider for further evaluation. °CAUSES  °· Heartburn. °· Pneumonia or bronchitis. °· Anxiety or stress. °· Inflammation around your heart (pericarditis) or lung (pleuritis or pleurisy). °· A blood clot in the lung. °· A collapsed lung (pneumothorax). It can develop suddenly on its own (spontaneous pneumothorax) or from trauma to the chest. °· Shingles infection (herpes zoster virus). °The chest wall is composed of bones, muscles, and cartilage. Any of these can be the source of the pain. °· The bones can be bruised by injury. °· The muscles or cartilage can be strained by coughing or overwork. °· The cartilage can be affected by inflammation and become sore (costochondritis). °DIAGNOSIS  °Lab tests or other studies may be needed to find the cause of your pain.  Your health care provider may have you take a test called an ambulatory electrocardiogram (ECG). An ECG records your heartbeat patterns over a 24-hour period. You may also have other tests, such as: °· Transthoracic echocardiogram (TTE). During echocardiography, sound waves are used to evaluate how blood flows through your heart. °· Transesophageal echocardiogram (TEE). °· Cardiac monitoring. This allows your health care provider to monitor your heart rate and rhythm in real time. °· Holter monitor. This is a portable device that records your heartbeat and can help diagnose heart arrhythmias. It allows your health care provider to track your heart activity for several days, if needed. °· Stress tests by exercise or by giving medicine that makes the heart beat faster. °TREATMENT  °· Treatment depends on what may be causing your chest pain. Treatment may include: °¨ Acid blockers for heartburn. °¨ Anti-inflammatory medicine. °¨ Pain medicine for inflammatory conditions. °¨ Antibiotics if an infection is present. °· You may be advised to change lifestyle habits. This includes stopping smoking and avoiding alcohol, caffeine, and chocolate. °· You may be advised to keep your head raised (elevated) when sleeping. This reduces the chance of acid going backward from your stomach into your esophagus. °Most of the time, nonspecific chest pain will improve within 2-3 days with rest and mild pain medicine.  °HOME CARE INSTRUCTIONS  °·   If antibiotics were prescribed, take them as directed. Finish them even if you start to feel better. °· For the next few days, avoid physical activities that bring on chest pain. Continue physical activities as directed. °· Do not use any tobacco products, including cigarettes, chewing tobacco, or electronic cigarettes. °· Avoid drinking alcohol. °· Only take medicine as directed by your health care provider. °· Follow your health care provider's suggestions for further testing if your chest pain  does not go away. °· Keep any follow-up appointments you made. If you do not go to an appointment, you could develop lasting (chronic) problems with pain. If there is any problem keeping an appointment, call to reschedule. °SEEK MEDICAL CARE IF:  °· Your chest pain does not go away, even after treatment. °· You have a rash with blisters on your chest. °· You have a fever. °SEEK IMMEDIATE MEDICAL CARE IF:  °· You have increased chest pain or pain that spreads to your arm, neck, jaw, back, or abdomen. °· You have shortness of breath. °· You have an increasing cough, or you cough up blood. °· You have severe back or abdominal pain. °· You feel nauseous or vomit. °· You have severe weakness. °· You faint. °· You have chills. °This is an emergency. Do not wait to see if the pain will go away. Get medical help at once. Call your local emergency services (911 in [place] Do not drive yourself to the hospital. °MAKE SURE YOU:  °· Understand these instructions. °· Will watch your condition. °· Will get help right away if you are not doing well or get worse. °Document Released: 12/19/2004 Document Revised: 03/16/2013 Document Reviewed: 10/15/2007 °ExitCare® Patient Information ©2015 [employer]. This information is not intended to replace advice given to you by your health care provider. Make sure you discuss any questions you have with your health care provider. ° °

## 2014-01-23 NOTE — ED Provider Notes (Signed)
See Epic downtime paperwork.  Nursing notes and vitals signs, including pulse oximetry, reviewed.  Summary of this visit's results, reviewed by myself:  Labs:  Results for orders placed or performed in visit on 01/23/14 (from the past 24 hour(s))  Urine microscopic-add on     Status: Abnormal   Collection Time: 01/23/14  2:50 AM  Result Value Ref Range   Squamous Epithelial / LPF FEW (A) RARE   WBC, UA 3-6 <3 WBC/hpf   RBC / HPF 7-10 <3 RBC/hpf   Bacteria, UA FEW (A) RARE    Imaging Studies: Ct Abdomen Pelvis Wo Contrast  01/23/2014   CLINICAL DATA:  Left flank pain.  EXAM: CT ABDOMEN AND PELVIS WITHOUT CONTRAST  TECHNIQUE: Multidetector CT imaging of the abdomen and pelvis was performed following the standard protocol without IV contrast.  COMPARISON:  CT scan dated 05/30/2013  FINDINGS: Gallbladder has been removed. Biliary tree is otherwise normal. Liver, spleen, pancreas, adrenal glands, and kidneys are normal. The bowel is normal. Uterus and ovaries are normal. Bladder is normal. No osseous abnormality.  IMPRESSION: Benign appearing abdomen and pelvis.   Electronically Signed   By: Geanie CooleyJim  Maxwell M.D.   On: 01/23/2014 05:51   5:59 AM Patient advised to CT and urinalysis findings. Urinalysis concerning for urinary tract infection. Location of her discomfort may indicate early pyelonephritis. We will treat accordingly. They were advised to return for worsening symptoms.   Carlisle BeersJohn L Ekansh Sherk, MD 01/23/14 0600

## 2014-01-25 LAB — URINE CULTURE: Colony Count: 50000

## 2014-02-14 ENCOUNTER — Other Ambulatory Visit: Payer: Self-pay | Admitting: Family Medicine

## 2014-02-14 DIAGNOSIS — R079 Chest pain, unspecified: Secondary | ICD-10-CM

## 2014-02-14 DIAGNOSIS — R0789 Other chest pain: Secondary | ICD-10-CM

## 2014-02-16 ENCOUNTER — Other Ambulatory Visit: Payer: BC Managed Care – PPO

## 2014-06-24 ENCOUNTER — Emergency Department (HOSPITAL_COMMUNITY)
Admission: EM | Admit: 2014-06-24 | Discharge: 2014-06-24 | Disposition: A | Discharge status: Home / Self Care | Payer: BLUE CROSS/BLUE SHIELD | Attending: Emergency Medicine | Admitting: Emergency Medicine

## 2014-06-24 ENCOUNTER — Emergency Department (HOSPITAL_COMMUNITY): Payer: BLUE CROSS/BLUE SHIELD

## 2014-06-24 ENCOUNTER — Encounter (HOSPITAL_COMMUNITY): Payer: Self-pay | Admitting: *Deleted

## 2014-06-24 DIAGNOSIS — R51 Headache: Secondary | ICD-10-CM | POA: Insufficient documentation

## 2014-06-24 DIAGNOSIS — R079 Chest pain, unspecified: Secondary | ICD-10-CM | POA: Diagnosis present

## 2014-06-24 DIAGNOSIS — R1013 Epigastric pain: Secondary | ICD-10-CM | POA: Insufficient documentation

## 2014-06-24 DIAGNOSIS — Z79899 Other long term (current) drug therapy: Secondary | ICD-10-CM | POA: Diagnosis not present

## 2014-06-24 DIAGNOSIS — R519 Headache, unspecified: Secondary | ICD-10-CM

## 2014-06-24 DIAGNOSIS — Z792 Long term (current) use of antibiotics: Secondary | ICD-10-CM | POA: Diagnosis not present

## 2014-06-24 DIAGNOSIS — E669 Obesity, unspecified: Secondary | ICD-10-CM | POA: Diagnosis not present

## 2014-06-24 DIAGNOSIS — Z6839 Body mass index (BMI) 39.0-39.9, adult: Secondary | ICD-10-CM | POA: Insufficient documentation

## 2014-06-24 DIAGNOSIS — Z87891 Personal history of nicotine dependence: Secondary | ICD-10-CM | POA: Diagnosis not present

## 2014-06-24 LAB — CBC WITH DIFFERENTIAL/PLATELET
BASOS ABS: 0 10*3/uL (ref 0.0–0.1)
Basophils Relative: 0 % (ref 0–1)
Eosinophils Absolute: 0.1 10*3/uL (ref 0.0–0.7)
Eosinophils Relative: 2 % (ref 0–5)
HEMATOCRIT: 36.9 % (ref 36.0–46.0)
Hemoglobin: 12.6 g/dL (ref 12.0–15.0)
Lymphocytes Relative: 37 % (ref 12–46)
Lymphs Abs: 2.2 10*3/uL (ref 0.7–4.0)
MCH: 33 pg (ref 26.0–34.0)
MCHC: 34.1 g/dL (ref 30.0–36.0)
MCV: 96.6 fL (ref 78.0–100.0)
MONO ABS: 0.4 10*3/uL (ref 0.1–1.0)
MONOS PCT: 7 % (ref 3–12)
NEUTROS ABS: 3.3 10*3/uL (ref 1.7–7.7)
Neutrophils Relative %: 54 % (ref 43–77)
Platelets: 333 10*3/uL (ref 150–400)
RBC: 3.82 MIL/uL — AB (ref 3.87–5.11)
RDW: 11.6 % (ref 11.5–15.5)
WBC: 6 10*3/uL (ref 4.0–10.5)

## 2014-06-24 LAB — COMPREHENSIVE METABOLIC PANEL
ALK PHOS: 63 U/L (ref 39–117)
ALT: 21 U/L (ref 0–35)
ANION GAP: 11 (ref 5–15)
AST: 33 U/L (ref 0–37)
Albumin: 2.9 g/dL — ABNORMAL LOW (ref 3.5–5.2)
BILIRUBIN TOTAL: 0.3 mg/dL (ref 0.3–1.2)
BUN: 9 mg/dL (ref 6–23)
CHLORIDE: 104 mmol/L (ref 96–112)
CO2: 21 mmol/L (ref 19–32)
Calcium: 8.1 mg/dL — ABNORMAL LOW (ref 8.4–10.5)
Creatinine, Ser: 0.64 mg/dL (ref 0.50–1.10)
GFR calc Af Amer: >90 mL/min (ref >90)
GFR calc non Af Amer: >90 mL/min (ref >90)
Glucose, Bld: 86 mg/dL (ref 70–99)
Potassium: 3.4 mmol/L — ABNORMAL LOW (ref 3.5–5.1)
Sodium: 136 mmol/L (ref 135–145)
Total Protein: 6.6 g/dL (ref 6.0–8.3)

## 2014-06-24 LAB — URINALYSIS, ROUTINE W REFLEX MICROSCOPIC
BILIRUBIN URINE: NEGATIVE
Glucose, UA: NEGATIVE mg/dL
Ketones, ur: NEGATIVE mg/dL
LEUKOCYTES UA: NEGATIVE
NITRITE: POSITIVE — AB
PH: 6 (ref 5.0–8.0)
Protein, ur: NEGATIVE mg/dL
SPECIFIC GRAVITY, URINE: 1.013 (ref 1.005–1.030)
UROBILINOGEN UA: 0.2 mg/dL (ref 0.0–1.0)

## 2014-06-24 LAB — URINE MICROSCOPIC-ADD ON

## 2014-06-24 LAB — LIPASE, BLOOD: Lipase: 25 U/L (ref 11–59)

## 2014-06-24 LAB — I-STAT TROPONIN, ED: Troponin i, poc: 0 ng/mL (ref 0.00–0.08)

## 2014-06-24 LAB — I-STAT CG4 LACTIC ACID, ED: Lactic Acid, Venous: 1.04 mmol/L (ref 0.5–2.0)

## 2014-06-24 IMAGING — CR DG CHEST 2V
2 series · 2 of 2 positions shown · non-contrast
Comparison: 12/14/2011

CLINICAL DATA: Shortness of breath, back pain, chest pain.

CHEST - 2 VIEW

[w chest pa]
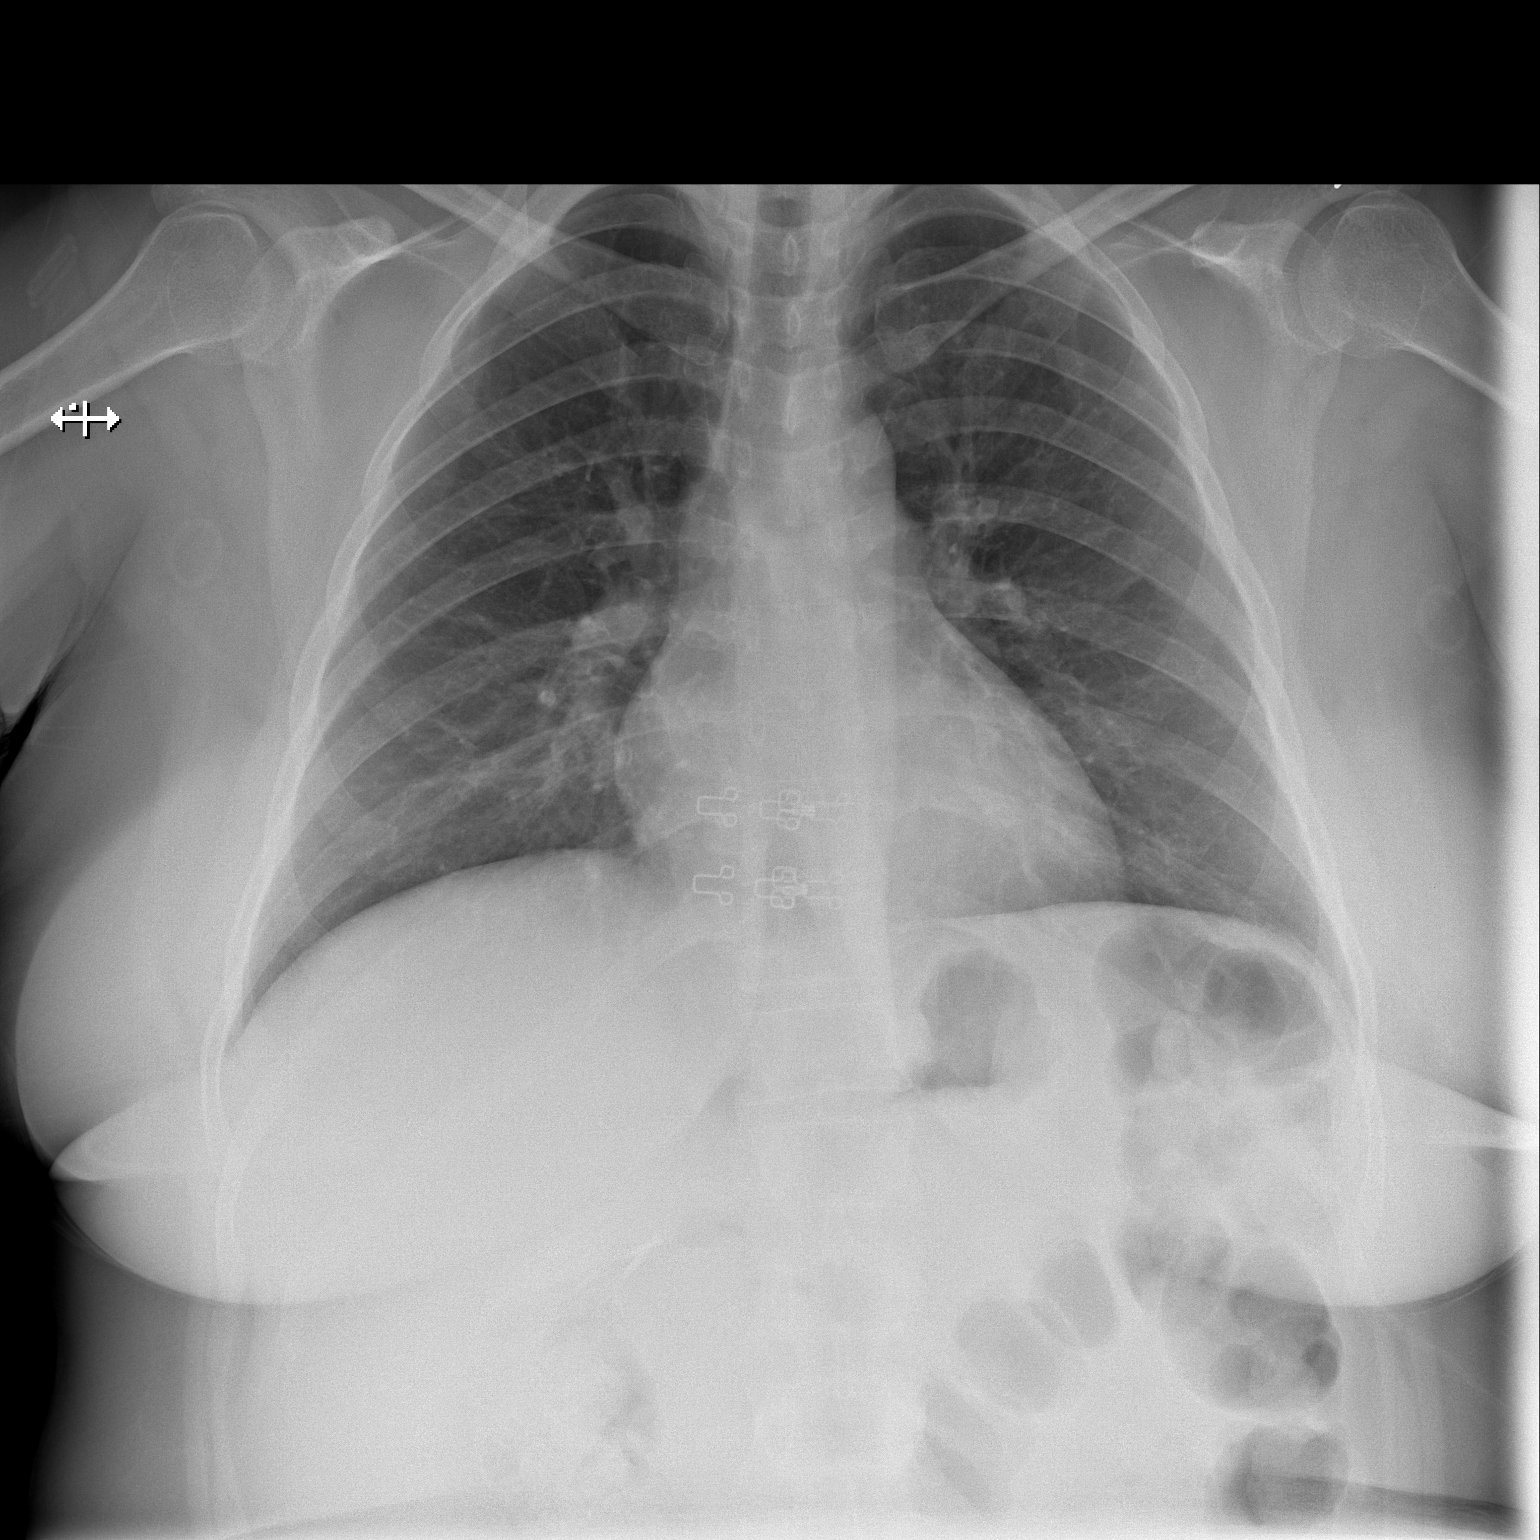

[w chest lat]
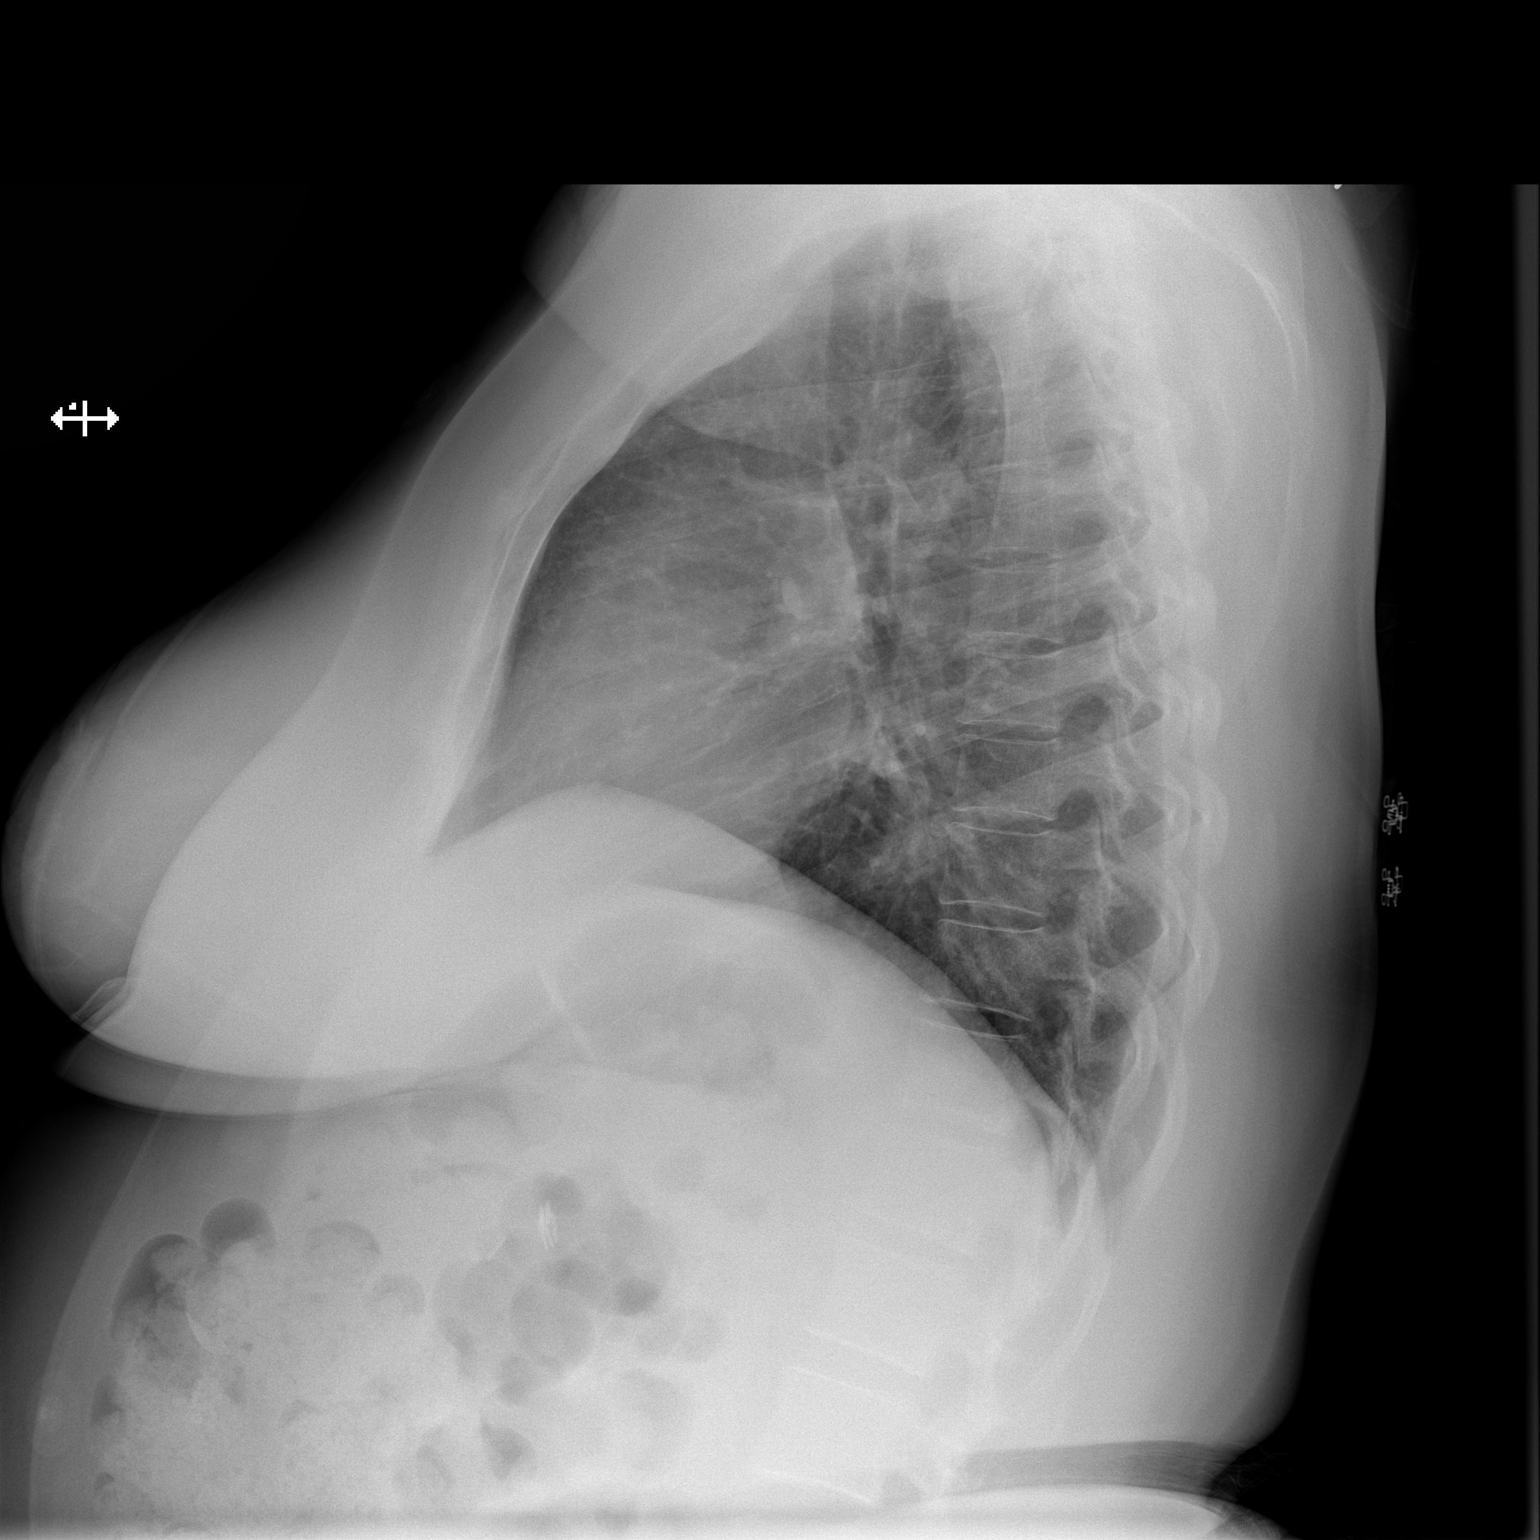

[2 of 2 positions shown; findings below may reference images not displayed]

FINDINGS: Heart and mediastinal contours are within normal limits.
No focal opacities or effusions.  No acute bony abnormality.
IMPRESSION: No active cardiopulmonary disease.

## 2014-06-24 MED ORDER — METOCLOPRAMIDE HCL 5 MG/ML IJ SOLN
10.0000 mg | Freq: Once | INTRAMUSCULAR | Status: AC
  Administered 2014-06-24: 10 mg via INTRAVENOUS
  Filled 2014-06-24: qty 2

## 2014-06-24 MED ORDER — DEXAMETHASONE SODIUM PHOSPHATE 10 MG/ML IJ SOLN
10.0000 mg | Freq: Once | INTRAMUSCULAR | Status: AC
  Administered 2014-06-24: 10 mg via INTRAVENOUS
  Filled 2014-06-24: qty 1

## 2014-06-24 MED ORDER — OXYCODONE-ACETAMINOPHEN 5-325 MG PO TABS
2.0000 | ORAL_TABLET | Freq: Once | ORAL | Status: AC
  Administered 2014-06-24: 2 via ORAL
  Filled 2014-06-24: qty 2

## 2014-06-24 MED ORDER — DIPHENHYDRAMINE HCL 50 MG/ML IJ SOLN
25.0000 mg | Freq: Once | INTRAMUSCULAR | Status: AC
  Administered 2014-06-24: 25 mg via INTRAVENOUS
  Filled 2014-06-24: qty 1

## 2014-06-24 NOTE — ED Notes (Signed)
Brought pt back to room with family in tow; pt undressed, in gown, on monitor, continuous pulse oximetry and blood pressure cuff; Sarah, NT present in room

## 2014-06-24 NOTE — ED Provider Notes (Signed)
CSN: [id number]     Arrival date & time 06/24/14  0712 History   First MD Initiated Contact with Patient 06/24/14 [phone]     Chief Complaint  Patient presents with  . Chest Pain     (Consider location/radiation/quality/duration/timing/severity/associated sxs/prior Treatment) Patient is a 44 y.o. female presenting with chest pain and headaches. The history is provided by the patient.  Chest Pain Pain location:  Substernal area Pain quality: aching   Pain radiates to:  Does not radiate Pain severity:  Mild Onset quality:  Gradual Timing:  Constant Progression:  Unchanged Context: not breathing and not at rest   Relieved by:  Nothing Worsened by:  Nothing tried Associated symptoms: abdominal pain and headache   Associated symptoms: no cough, no fever, no nausea, no shortness of breath, no syncope and not vomiting   Headache Pain location:  Generalized Quality:  Dull Radiates to:  Does not radiate Onset quality:  Gradual Duration:  5 days Timing:  Constant Progression:  Waxing and waning Chronicity:  Recurrent Similar to prior headaches: no   Context: not activity and not exposure to bright light   Relieved by:  Nothing Worsened by:  Nothing Ineffective treatments:  None tried Associated symptoms: abdominal pain   Associated symptoms: no blurred vision, no cough, no diarrhea, no facial pain, no fever, no nausea, no sinus pressure, no syncope and no vomiting     Past Medical History  Diagnosis Date  . No pertinent past medical history   . Obesity (BMI 30-39.9)    Past Surgical History  Procedure Laterality Date  . Tubal ligation    . Cholecystectomy  02/20/2011    Procedure: LAPAROSCOPIC CHOLECYSTECTOMY WITH INTRAOPERATIVE CHOLANGIOGRAM;  Surgeon: Kandis Cocking, MD;  Location: [facility] OR;  Service: General;  Laterality: N/A;   Family History  Problem Relation Age of Onset  . Diabetes Paternal Grandmother   . Diabetes Paternal Grandfather   . Diabetes Maternal Grandmother    . Diabetes Maternal Grandfather   . Diabetes Mother   . Heart disease Mother   . Diabetes Father   . Heart disease Father   . Colon cancer Neg Hx   . Rectal cancer Neg Hx   . Stomach cancer Neg Hx   . Esophageal cancer Neg Hx    History  Substance Use Topics  . Smoking status: Former Smoker    Quit date: 03/11/2011  . Smokeless tobacco: Current User  . Alcohol Use: No   OB History    No data available     Review of Systems  Constitutional: Negative for fever and chills.  HENT: Negative for sinus pressure.   Eyes: Negative for blurred vision.  Respiratory: Negative for cough and shortness of breath.   Cardiovascular: Positive for chest pain (substernal, epigastric). Negative for syncope.  Gastrointestinal: Positive for abdominal pain. Negative for nausea, vomiting and diarrhea.  Neurological: Positive for headaches.  All other systems reviewed and are negative.     Allergies  Review of patient's allergies indicates no known allergies.  Home Medications   Prior to Admission medications   Medication Sig Start Date End Date Taking? Authorizing Provider  ciprofloxacin (CIPRO) 500 MG tablet Take 1 tablet (500 mg total) by mouth 2 (two) times daily. One po bid x 7 days 01/23/14   Paula Libra, MD  HYDROcodone-acetaminophen (NORCO/VICODIN) 5-325 MG per tablet Take 1-2 tablets by mouth every 6 (six) hours as needed (for pain). 01/23/14   John Molpus, MD  omeprazole (PRILOSEC) 20 MG  capsule Take 20 mg by mouth daily. 05/29/13   Shade FloodJeffrey R Greene, MD  ondansetron (ZOFRAN) 4 MG tablet Take 1 tablet (4 mg total) by mouth every 6 (six) hours. 05/30/13   Tiffany Neva SeatGreene, PA-C   BP 127/54 mmHg  Pulse 80  Temp(Src) 97.7 F (36.5 C) (Oral)  Resp 18  Ht 5\' 1"  (1.549 m)  Wt 213 lb (96.616 kg)  BMI 40.27 kg/m2  SpO2 100% Physical Exam  Constitutional: She is oriented to person, place, and time. She appears well-developed and well-nourished. No distress.  HENT:  Head: Normocephalic and  atraumatic.  Mouth/Throat: Oropharynx is clear and moist.  Eyes: EOM are normal. Pupils are equal, round, and reactive to light.  Neck: Normal range of motion. Neck supple.  Cardiovascular: Normal rate and regular rhythm.  Exam reveals no friction rub.   No murmur heard. Pulmonary/Chest: Effort normal and breath sounds normal. No respiratory distress. She has no wheezes. She has no rales.  Abdominal: Soft. She exhibits no distension and no mass. There is tenderness (mild, diffuse upper abdominal pain). There is no rebound and no guarding.  Musculoskeletal: Normal range of motion. She exhibits no edema.  Neurological: She is alert and oriented to person, place, and time.  Skin: Skin is warm. No rash noted. She is not diaphoretic.  Nursing note and vitals reviewed.   ED Course  Procedures (including critical care time) Labs Review Labs Reviewed  COMPREHENSIVE METABOLIC PANEL  LIPASE, BLOOD  CBC WITH DIFFERENTIAL/PLATELET  URINALYSIS, ROUTINE W REFLEX MICROSCOPIC  I-STAT TROPOININ, ED  I-STAT CG4 LACTIC ACID, ED    Imaging Review Dg Chest 2 View  06/24/2014   CLINICAL DATA:  Left-sided chest pain for 1 week. Difficulty breathing  EXAM: CHEST  2 VIEW  COMPARISON:  January 23, 2014  FINDINGS: Lungs are clear. Heart size and pulmonary vascularity are normal. No adenopathy. No pneumothorax. No bone lesions.  IMPRESSION: No edema or consolidation.   Electronically Signed   By: Bretta BangWilliam  Woodruff III M.D.   On: 06/24/2014 08:25   Ct Head Wo Contrast  06/24/2014   CLINICAL DATA:  Chest pain with headache.  Sharp chest pain.  EXAM: CT HEAD WITHOUT CONTRAST  TECHNIQUE: Contiguous axial images were obtained from the base of the skull through the vertex without intravenous contrast.  COMPARISON:  None.  FINDINGS: No acute intracranial hemorrhage. No focal mass lesion. No CT evidence of acute infarction. No midline shift or mass effect. No hydrocephalus. Basilar cisterns are patent. Mastoid air cells  are clear. Scattered opacification ethmoid air cells.  IMPRESSION: No acute intracranial findings.  Mild sinus inflammation.   Electronically Signed   By: Genevive BiStewart  Edmunds M.D.   On: 06/24/2014 08:46     EKG Interpretation   Date/Time:  Friday June 24 2014 07:22:50 EDT Ventricular Rate:  86 PR Interval:  139 QRS Duration: 83 QT Interval:  383 QTC Calculation: 458 R Axis:   46 Text Interpretation:  Sinus rhythm Abnormal R-wave progression, early  transition No significant change since No significant change since last  tracing Confirmed by Gwendolyn GrantWALDEN  MD, Ryin Ambrosius ([id number]) on 06/24/2014 7:30:36 AM      MDM   Final diagnoses:  Acute nonintractable headache, unspecified headache type    85F multiple complaints. She's having headache, chest pain, abdominal pain Headache is been going on for 5 days. History of prior headaches, this is different. Waxing and waning over the past 5 days. No thunderclap onset. No fever, neck pain, blurry vision, numbness  or tingling. No neurologic complaints. Will obtain head CT, give migraine cocktail. Neurologically intact here. Chest pain and abdominal pain: History of multiple visits for each. Abdominal pain was worked up by GI, found to have gastritis. She had multiple negative CT scans and has had multiple visits for left upper quadrant abdominal pain. Chest pain is described as on both sides and sharp. Husband says this happens all the time. Does not appear consistent with ACS, check troponin, EKG, chest x-ray. On belly exam, she has diffuse upper abdominal pain. No peritoneal signs. No lower quadrant abdominal pain. Denies any urinary or vaginal symptoms. I do not think she needs a CT scan today. Will check CMP and lipase.  Labs ok. Nitrite positive urine, but no white cells and no urinary symptoms. Will send culture.  Feeling much better after her headache cocktail. Chest pain sharp, bilateral. PERC negative.  Instructed to f/u with PCP, given resource  guide.  Elwin Mocha, MD 06/24/14 1036

## 2014-06-24 NOTE — ED Notes (Signed)
Pt states that she has bilateral side and chest pain that is sharp. Family states that she always has it. Pt also reports a headache that started Monday.

## 2014-06-24 NOTE — Discharge Instructions (Signed)
General Headache Without Cause °A headache is pain or discomfort felt around the head or neck area. The specific cause of a headache may not be found. There are many causes and types of headaches. A few common ones are: °· Tension headaches. °· Migraine headaches. °· Cluster headaches. °· Chronic daily headaches. °HOME CARE INSTRUCTIONS  °· Keep all follow-up appointments with your caregiver or any specialist referral. °· Only take over-the-counter or prescription medicines for pain or discomfort as directed by your caregiver. °· Lie down in a dark, quiet room when you have a headache. °· Keep a headache journal to find out what may trigger your migraine headaches. For example, write down: °¨ What you eat and drink. °¨ How much sleep you get. °¨ Any change to your diet or medicines. °· Try massage or other relaxation techniques. °· Put ice packs or heat on the head and neck. Use these 3 to 4 times per day for 15 to 20 minutes each time, or as needed. °· Limit stress. °· Sit up straight, and do not tense your muscles. °· Quit smoking if you smoke. °· Limit alcohol use. °· Decrease the amount of caffeine you drink, or stop drinking caffeine. °· Eat and sleep on a regular schedule. °· Get 7 to 9 hours of sleep, or as recommended by your caregiver. °· Keep lights dim if bright lights bother you and make your headaches worse. °SEEK MEDICAL CARE IF:  °· You have problems with the medicines you were prescribed. °· Your medicines are not working. °· You have a change from the usual headache. °· You have nausea or vomiting. °SEEK IMMEDIATE MEDICAL CARE IF:  °· Your headache becomes severe. °· You have a fever. °· You have a stiff neck. °· You have loss of vision. °· You have muscular weakness or loss of muscle control. °· You start losing your balance or have trouble walking. °· You feel faint or pass out. °· You have severe symptoms that are different from your first symptoms. °MAKE SURE YOU:  °· Understand these  instructions. °· Will watch your condition. °· Will get help right away if you are not doing well or get worse. °Document Released: 03/11/2005 Document Revised: 06/03/2011 Document Reviewed: 03/27/2011 °ExitCare® Patient Information ©2015 [employer]. This information is not intended to replace advice given to you by your health care provider. Make sure you discuss any questions you have with your health care provider. ° ° °Emergency Department Resource Guide °1) Find a Doctor and Pay Out of Pocket °Although you won't have to find out who is covered by your insurance plan, it is a good idea to ask around and get recommendations. You will then need to call the office and see if the doctor you have chosen will accept you as a new patient and what types of options they offer for patients who are self-pay. Some doctors offer discounts or will set up payment plans for their patients who do not have insurance, but you will need to ask so you aren't surprised when you get to your appointment. ° °2) Contact Your Local Health Department °Not all health departments have doctors that can see patients for sick visits, but many do, so it is worth a call to see if yours does. If you don't know where your local health department is, you can check in your phone book. The CDC also has a tool to help you locate your state's health department, and many state websites also have listings   of all of their local health departments. ° °3) Find a Walk-in Clinic °If your illness is not likely to be very severe or complicated, you may want to try a walk in clinic. These are popping up all over the country in pharmacies, drugstores, and shopping centers. They're usually staffed by nurse practitioners or physician assistants that have been trained to treat common illnesses and complaints. They're usually fairly quick and inexpensive. However, if you have serious medical issues or chronic medical problems, these are probably not your best  option. ° °No Primary Care Doctor: °- Call [facility] at  832-8000 - they can help you locate a primary care doctor that  accepts your insurance, provides certain services, etc. °- Physician Referral Service- 1-800-533-3463 ° °Chronic Pain Problems: °Organization         Address  Phone   Notes  °[facility]  (336) 297-2271 Patients need to be referred by their primary care doctor.  ° °Medication Assistance: °Organization         Address  Phone   Notes  °[place] [facility] [address] °[place], [place] [address] (336) 641-8030 --Must be a resident of [place] °-- Must have NO insurance coverage whatsoever (no Medicaid/ Medicare, etc.) °-- The pt. MUST have a primary care doctor that directs their care regularly and follows them in the community °  °[facility]  (866) 331-1348   °[employer]  (888) 892-1162   ° °Agencies that provide inexpensive medical care: °Organization         Address  Phone   Notes  °[facility]  (336) 832-8035   °[facility]    (336) 832-7272   °[facility] [address] °[place], [place] [address] (336) 832-4777   °[facility] [address], °[place] (336) 271-4999   °[facility]    (336) 373-0678   °[facility]    (336) 272-1050   °[facility] ° [address], [place] Phone:  (336) 832-4444, Fax:  (336) 832-4440 Hours of Operation:  9 am - 6 pm, M-F.  Also accepts Medicaid/Medicare and self-pay.  °[facility] ° [address], [place] Phone: (336) 832-3150, Fax: (336) 832-3151. Hours of Operation:  8:30 am - 5:30 pm, M-F.  Also accepts Medicaid and self-pay.  °[facility] [place] [address], [place] Phone: (336) 878-6027   °[facility] [address], [place], [place] (336)723-1848, Ext. 123 Mondays & Thursdays: 7-9 AM.  First 15  patients are seen on a first come, first serve basis. °  ° °Medicaid-accepting [place] Providers: ° °Organization         Address  Phone   Notes  °[place] [facility] [address], [place] (336) 641-2100 Also accepts self-pay patients.  °[facility] [address], [place] ° (336) 856-9996   °[facility] [address], [place] (336) 288-8857   °[facility] [address] [place] [address], [place] (336) 299-7000   °Veita Bland [address],   ° (336) 373-1557 Only accepts [place] Access Medicaid patients after they have their name applied to their card.  ° °Self-Pay (no insurance) in [place]: ° °Organization         Address  Phone   Notes  °  Sickle Cell Patients, [facility] [address], [place] (336) 832-1970   °[facility] [address], [place] (336) 832-4400   °[facility] ° [address], [place] (336) 992-4800   °[facility]/Dr. Osei-Bonsu ° [address] [place] [address], [place] or [address], [place] (336) 841-8500 Phone number for both [place] and [place] locations is the same.  °[facility] [address], [place] (336) 299-0000   °[facility] [address] [place] [address], [place] or [address] (336) 852-7530 °(336) 878-2260   °[facility] [address], [place] (336) 350-1642, phone; (336) 294-5005, fax Sees patients 1st and 3rd Saturday of every month.  Must not qualify for public or private insurance (i.e. Medicaid, Medicare, [place] Health Choice, Veterans' Benefits) • Household income should be no more than 200% of the poverty level •The clinic cannot treat you if you are pregnant or think you are pregnant • Sexually transmitted diseases are not treated at the clinic.  ° ° °Dental  Care: °Organization         Address  Phone  Notes  °[facility] [address], [place] (336) 641-6152 Accepts children up to age 21 who are enrolled in Medicaid or [place] Health Choice; pregnant women with a Medicaid card; and children who have applied for Medicaid or [place] Health Choice, but were declined, whose parents can pay a reduced fee at time of service.  °[facility] [place]  [address], [place] (336) 641-7733 Accepts children up to age 21 who are enrolled in Medicaid or [place] Health Choice; pregnant women with a Medicaid card; and children who have applied for Medicaid or [place] Health Choice, but were declined, whose parents can pay a reduced fee at time of service.  °[facility] ° [address], [place] (336) 641-4533 Patients are seen by appointment only. Walk-ins are not accepted. [facility] will see patients 18 years of age and older. °Monday - Tuesday (8am-5pm) °Most Wednesdays (8:30-5pm) °$30 per visit, cash only  °[facility] ° [address], [place] (336) 641-4533 Patients are seen by appointment only. Walk-ins are not accepted. [facility] will see patients 18 years of age and older. °One Wednesday Evening (Monthly: Volunteer Based).  $30 per visit, cash only  °[facility]  (919) 537-3737 for adults; Children under age 4, call Graduate Pediatric Dentistry at (919) [phone]. Children aged 4-14, please call (919) [phone] to request a pediatric application. ° Dental services are provided in all areas of dental care including fillings, crowns and bridges, complete and partial dentures, implants, gum treatment, root canals, and extractions. Preventive care is also provided. Treatment is provided to both adults and children. °Patients are selected via a lottery and there is often a waiting list. °  °[facility] [address], °[place] ° (336) 763-8833 [email or web address] °  °[facility] [address], [place], [place] (336)723-1848, Ext. 123 Second and Fourth Thursday of each month, opens at 6:30 AM; Clinic ends at 9 AM.  Patients are seen on a first-come first-served basis, and a limited number are seen during each clinic.  ° °[facility] ° [address], [place], [place] (336) 723-7904   Eligibility   Requirements °You must have lived in [place], [place], or [place] counties for at least the last three months. °  You cannot be eligible for state or federal sponsored healthcare insurance, including Veterans Administration, Medicaid, or Medicare. °  You generally cannot be eligible for healthcare insurance through your employer.  °  How to apply: °Eligibility screenings are held every Tuesday and Wednesday afternoon from 1:00 pm until 4:00 pm. You do not need an appointment for the interview!  °[facility] [address], [place], [place] 336-631-2330   °[facility]  336-342-8273   °[facility]  336-703-3100   °[facility]  336-570-6415   ° °Behavioral Health Resources in the Community: °Intensive Outpatient Programs °Organization         Address  Phone  Notes  °[facility] [address], [place], [place] 336-878-6098   °[facility] [address], [place], [place] 336-832-9800   °[facility] [address], [place], [place] ° 336-882-2125   °[facility] [address],  °[place], [place] 1-800-853-5163 or 336-641-4981   °Substance Abuse Resources °Organization         Address  Phone  Notes  °[facility]  336-882-2125   °[facility]  336-784-9470   °[facility]  336-285-9073   °[facility]  336-845-3988   °Residential & Outpatient Substance Abuse Program  1-800-659-3381    °Psychological Services °Organization         Address  Phone  Notes  °[facility]  336- 832-9600   °[facility]  336- 378-7881   °[facility] [address], [place] 1-800-853-5163 or 336-641-4981   ° °Mobile Crisis Teams °Organization         Address  Phone  Notes  °[facility], Mobile Crisis Care Unit  1-877-626-1772   °Assertive °[facility] ° [address] [place], [place] 336-834-9664   °Sharon DeEsch [address] °[place] 336-554-5454   ° °Self-Help/Support Groups °Organization         Address  Phone             Notes  °[facility] - variety of support groups  336- 373-1402 Call for more information  °Narcotics Anonymous (NA), [facility] [address], °[place]  2 meetings at this location  ° °Residential Treatment Programs °Organization         Address  Phone  Notes  °[facility] [address],    °[place]  1-866-801-8205   °[facility] ° [address], [place], [place] 704-293-8524   °[facility] [address], [place] 336-845-3988 Admissions: 8am-3pm M-F  °[facility] [address]    °[place], [place] 336-841-1104   °[facility] [address], [place], [place] 336-379-7146   °[facility] [address]  °[place], [place] 336-285-9073   °[facility] Intensive Outpatient [address], [place], [place] 336-852-3033   °[facility] [address]  °[place], [place] 1-877-615-2722 or 336-784-9470   °[facility] [address] [place], [place] 336-227-7417 Accepts Medicaid  °[facility] [address]  °  1-800-659-3381 Substance Abuse/Addiction Treatment  ° °[place] Behavioral Health Resources °Organization           Address  Phone  Notes  °[facility]  (888) 581-9988   °Julie Brannon, PhD [address] [place], [place]   (336) 349-5553 or (336) 951-0000   °[facility]   [address] °[place], [place] (336) 349-4454   °[facility] [address], [place], [place] (336) 342-8316 Insurance/Medicaid/sponsorship through [facility]  °[facility] [address]                                    [place], [place] (336) 342-8316 Therapy/tele-psych/case  °[facility] [address]  ° [place], [place] (336) 349-2233    °Dr. Arfeen  (336) 349-4544   °[facility] [place]  [employer] [place] [facility] 1) [address],  °2) [address], [place] °3)  [address], [place] (336) 349-3220 °(336) 342-7768 ° °(336) 342-8140   °[place] [facility] (336) 342-1394 or (336) 342-3537 (After Hours)    ° ° ° °

## 2014-06-27 LAB — URINE CULTURE: Colony Count: >=100000

## 2014-06-28 ENCOUNTER — Telehealth (HOSPITAL_BASED_OUTPATIENT_CLINIC_OR_DEPARTMENT_OTHER): Payer: Self-pay | Admitting: Emergency Medicine

## 2014-06-28 NOTE — Progress Notes (Signed)
ED Antimicrobial Stewardship Positive Culture Follow Up   [name] is an 44 y.o. female who [facility] with a chief complaint of  Chief Complaint  Patient presents with  . Chest Pain    Recent Results (from the past 720 hour(s))  Urine culture     Status: None   Collection Time: 06/24/14  8:55 AM  Result Value Ref Range Status   Specimen Description URINE, CLEAN CATCH  Final   Special Requests NONE  Final   Colony Count   Final    >=100,000 COLONIES/ML Performed at [facility]    Culture   Final    ESCHERICHIA COLI Performed at [facility]    Report Status 06/27/2014 FINAL  Final   Organism ID, Bacteria ESCHERICHIA COLI  Final      Susceptibility   Escherichia coli - MIC*    AMPICILLIN <=2 SENSITIVE Sensitive     CEFAZOLIN <=4 SENSITIVE Sensitive     CEFTRIAXONE <=1 SENSITIVE Sensitive     CIPROFLOXACIN >=4 RESISTANT Resistant     GENTAMICIN <=1 SENSITIVE Sensitive     LEVOFLOXACIN >=8 RESISTANT Resistant     NITROFURANTOIN <=16 SENSITIVE Sensitive     TOBRAMYCIN <=1 SENSITIVE Sensitive     TRIMETH/SULFA <=20 SENSITIVE Sensitive     PIP/TAZO <=4 SENSITIVE Sensitive     * ESCHERICHIA COLI   [x]  Patient discharged originally without antimicrobial agent and treatment is now indicated  New antibiotic prescription: Bactrim DS 1 tablet BID x 3 days  ED Provider: Celene Skeenobyn Hess, PA-C   [name], [name] 06/28/2014, 10:43 AM Infectious Diseases Pharmacist Phone# [phone]

## 2014-06-29 ENCOUNTER — Telehealth: Payer: Self-pay | Admitting: Emergency Medicine

## 2014-06-30 ENCOUNTER — Telehealth (HOSPITAL_BASED_OUTPATIENT_CLINIC_OR_DEPARTMENT_OTHER): Payer: Self-pay | Admitting: Emergency Medicine

## 2014-06-30 NOTE — Telephone Encounter (Signed)
Post ED Visit - Positive Culture Follow-up: Successful Patient Follow-Up  Culture assessed and recommendations reviewed by: []  Wes Dulaney, Pharm.D., BCPS [x]  Celedonio MiyamotoJeremy Frens, Pharm.D., BCPS []  Georgina PillionElizabeth Martin, Pharm.D., BCPS []  [place]Minh Pham, VermontPharm.D., BCPS, AAHIVP []  Estella HuskMichelle Turner, Pharm.D., BCPS, AAHIVP []  Red ChristiansSamson Lee, Pharm.D. []  Tennis Mustassie Stewart, Pharm.D.  Positive urine culture E. Coli  [x]  Patient discharged without antimicrobial prescription and treatment is now indicated []  Organism is resistant to prescribed ED discharge antimicrobial []  Patient with positive blood cultures  Changes discussed with ED provider: Celene Skeenobyn Hess PA New antibiotic prescription Bactrim BID x 3 days [facility] @ [phone]  Contacted patient, date 06/30/14 [id number]   Berle MullMiller, Denzil Mceachron 06/30/2014, 8:23 AM

## 2015-04-19 ENCOUNTER — Emergency Department (HOSPITAL_COMMUNITY)
Admission: EM | Admit: 2015-04-19 | Discharge: 2015-04-19 | Disposition: A | Discharge status: Home / Self Care | Payer: Medicaid Other | Attending: Emergency Medicine | Admitting: Emergency Medicine

## 2015-04-19 ENCOUNTER — Emergency Department (HOSPITAL_COMMUNITY): Payer: Medicaid Other

## 2015-04-19 ENCOUNTER — Encounter (HOSPITAL_COMMUNITY): Payer: Self-pay | Admitting: *Deleted

## 2015-04-19 DIAGNOSIS — Z8719 Personal history of other diseases of the digestive system: Secondary | ICD-10-CM | POA: Insufficient documentation

## 2015-04-19 DIAGNOSIS — E669 Obesity, unspecified: Secondary | ICD-10-CM | POA: Diagnosis not present

## 2015-04-19 DIAGNOSIS — Z9851 Tubal ligation status: Secondary | ICD-10-CM | POA: Insufficient documentation

## 2015-04-19 DIAGNOSIS — R1012 Left upper quadrant pain: Secondary | ICD-10-CM | POA: Insufficient documentation

## 2015-04-19 DIAGNOSIS — Z79899 Other long term (current) drug therapy: Secondary | ICD-10-CM | POA: Insufficient documentation

## 2015-04-19 DIAGNOSIS — Z87891 Personal history of nicotine dependence: Secondary | ICD-10-CM | POA: Diagnosis not present

## 2015-04-19 DIAGNOSIS — Z9049 Acquired absence of other specified parts of digestive tract: Secondary | ICD-10-CM | POA: Insufficient documentation

## 2015-04-19 DIAGNOSIS — R079 Chest pain, unspecified: Secondary | ICD-10-CM | POA: Diagnosis present

## 2015-04-19 HISTORY — DX: Calculus of gallbladder without cholecystitis without obstruction: K80.20

## 2015-04-19 LAB — CBC
HCT: 39.2 % (ref 36.0–46.0)
Hemoglobin: 13.7 g/dL (ref 12.0–15.0)
MCH: 33.2 pg (ref 26.0–34.0)
MCHC: 34.9 g/dL (ref 30.0–36.0)
MCV: 94.9 fL (ref 78.0–100.0)
Platelets: 335 10*3/uL (ref 150–400)
RBC: 4.13 MIL/uL (ref 3.87–5.11)
RDW: 11.5 % (ref 11.5–15.5)
WBC: 3.7 10*3/uL — ABNORMAL LOW (ref 4.0–10.5)

## 2015-04-19 LAB — HEPATIC FUNCTION PANEL
ALK PHOS: 67 U/L (ref 38–126)
ALT: 27 U/L (ref 14–54)
AST: 39 U/L (ref 15–41)
Albumin: 2.9 g/dL — ABNORMAL LOW (ref 3.5–5.0)
TOTAL PROTEIN: 6.9 g/dL (ref 6.5–8.1)
Total Bilirubin: 0.4 mg/dL (ref 0.3–1.2)

## 2015-04-19 LAB — BASIC METABOLIC PANEL
Anion gap: 9 (ref 5–15)
BUN: 9 mg/dL (ref 6–20)
CO2: 21 mmol/L — AB (ref 22–32)
Calcium: 8.2 mg/dL — ABNORMAL LOW (ref 8.9–10.3)
Chloride: 102 mmol/L (ref 101–111)
Creatinine, Ser: 0.56 mg/dL (ref 0.44–1.00)
GFR calc Af Amer: >60 mL/min (ref >60)
GFR calc non Af Amer: >60 mL/min (ref >60)
GLUCOSE: 142 mg/dL — AB (ref 65–99)
POTASSIUM: 2.8 mmol/L — AB (ref 3.5–5.1)
Sodium: 132 mmol/L — ABNORMAL LOW (ref 135–145)

## 2015-04-19 LAB — LIPASE, BLOOD: LIPASE: 30 U/L (ref 11–51)

## 2015-04-19 LAB — I-STAT TROPONIN, ED: Troponin i, poc: 0 ng/mL (ref 0.00–0.08)

## 2015-04-19 MED ORDER — SUCRALFATE 1 GM/10ML PO SUSP
1.0000 g | Freq: Once | ORAL | Status: AC
  Administered 2015-04-19: 1 g via ORAL
  Filled 2015-04-19: qty 10

## 2015-04-19 MED ORDER — SUCRALFATE 1 GM/10ML PO SUSP: 1.0000 g | Freq: Two times a day (BID) | ORAL | Status: DC | PRN

## 2015-04-19 MED ORDER — RANITIDINE HCL 150 MG PO CAPS
150.0000 mg | ORAL_CAPSULE | Freq: Every day | ORAL | Status: DC
Start: 2015-04-19 — End: 2015-08-18

## 2015-04-19 MED ORDER — MAGNESIUM SULFATE 2 GM/50ML IV SOLN
2.0000 g | Freq: Once | INTRAVENOUS | Status: AC
  Administered 2015-04-19: 2 g via INTRAVENOUS
  Filled 2015-04-19: qty 50

## 2015-04-19 MED ORDER — POTASSIUM CHLORIDE CRYS ER 20 MEQ PO TBCR
60.0000 meq | EXTENDED_RELEASE_TABLET | Freq: Once | ORAL | Status: AC
Start: 2015-04-19 — End: 2015-04-19
  Administered 2015-04-19: 60 meq via ORAL
  Filled 2015-04-19: qty 3

## 2015-04-19 MED ORDER — RANITIDINE HCL 150 MG/10ML PO SYRP
300.0000 mg | ORAL_SOLUTION | Freq: Once | ORAL | Status: AC
  Administered 2015-04-19: 300 mg via ORAL
  Filled 2015-04-19: qty 20

## 2015-04-19 NOTE — ED Provider Notes (Signed)
CSN: [id number]     Arrival date & time 04/19/15  4098 History  By signing my name below, I, Bethel Born, attest that this documentation has been prepared under the direction and in the presence of Tomasita Crumble, MD. Electronically Signed: Bethel Born, ED Scribe. 04/19/2015. 4:01 AM   Chief Complaint  Patient presents with  . Chest Pain   The history is provided by the patient. No language interpreter was used.   [name] is a 45 y.o. female with history of gallstones s/p cholecystectomy  and obesity  who presents to the Emergency Department complaining of recurrent, intermittent, sharp/burning, left-sided chest pain with onset earlier this week. The pain occasionally radiates to the back and across the abdomen. Pt states " I always have pain here", and notes that it has been going on for years. She has been seen for the pain in the past but is not aware of a diagnosis. Pt is pain free at present and notes that the pain has no known pattern, triggers, or modifying factors.  The "purple" medication for gastritis provided insufficient pain relief at home. Associated symptoms include SOB, nausea and vomiting yesterday (none today). No change in appetite.  Pt denies SOB, diarrhea, difficulty urinating, fever, and sweating.    Past Medical History  Diagnosis Date  . No pertinent past medical history   . Obesity (BMI 30-39.9)   . Gallstones    Past Surgical History  Procedure Laterality Date  . Tubal ligation    . Cholecystectomy  02/20/2011    Procedure: LAPAROSCOPIC CHOLECYSTECTOMY WITH INTRAOPERATIVE CHOLANGIOGRAM;  Surgeon: Kandis Cocking, MD;  Location: [facility] OR;  Service: General;  Laterality: N/A;   Family History  Problem Relation Age of Onset  . Diabetes Paternal Grandmother   . Diabetes Paternal Grandfather   . Diabetes Maternal Grandmother   . Diabetes Maternal Grandfather   . Diabetes Mother   . Heart disease Mother   . Diabetes Father   . Heart disease Father   .  Colon cancer Neg Hx   . Rectal cancer Neg Hx   . Stomach cancer Neg Hx   . Esophageal cancer Neg Hx    Social History  Substance Use Topics  . Smoking status: Former Smoker    Quit date: 03/11/2011  . Smokeless tobacco: Current User  . Alcohol Use: No   OB History    No data available     Review of Systems  10 Systems reviewed and all are negative for acute change except as noted in the HPI.  Allergies  Review of patient's allergies indicates no known allergies.  Home Medications   Prior to Admission medications   Medication Sig Start Date End Date Taking? Authorizing Provider  ciprofloxacin (CIPRO) 500 MG tablet Take 1 tablet (500 mg total) by mouth 2 (two) times daily. One po bid x 7 days 01/23/14   Paula Libra, MD  HYDROcodone-acetaminophen (NORCO/VICODIN) 5-325 MG per tablet Take 1-2 tablets by mouth every 6 (six) hours as needed (for pain). 01/23/14   John Molpus, MD  omeprazole (PRILOSEC) 20 MG capsule Take 20 mg by mouth daily. 05/29/13   Shade Flood, MD  ondansetron (ZOFRAN) 4 MG tablet Take 1 tablet (4 mg total) by mouth every 6 (six) hours. 05/30/13   Tiffany Neva Seat, PA-C   BP 137/85 mmHg  Pulse 87  Temp(Src) 97.9 F (36.6 C) (Oral)  Resp 22  SpO2 96%  LMP 03/20/2015 Physical Exam  Constitutional: She is oriented to person,  place, and time. She appears well-developed and well-nourished. No distress.  HENT:  Head: Normocephalic and atraumatic.  Nose: Nose normal.  Mouth/Throat: Oropharynx is clear and moist. No oropharyngeal exudate.  Eyes: Conjunctivae and EOM are normal. Pupils are equal, round, and reactive to light. No scleral icterus.  Neck: Normal range of motion. Neck supple. No JVD present. No tracheal deviation present. No thyromegaly present.  Cardiovascular: Normal rate, regular rhythm and normal heart sounds.  Exam reveals no gallop and no friction rub.   No murmur heard. Pulmonary/Chest: Effort normal and breath sounds normal. No respiratory  distress. She has no wheezes. She exhibits no tenderness.  Abdominal: Soft. Bowel sounds are normal. She exhibits no distension and no mass. There is no tenderness. There is no rebound and no guarding.  Musculoskeletal: Normal range of motion. She exhibits no edema or tenderness.  Lymphadenopathy:    She has no cervical adenopathy.  Neurological: She is alert and oriented to person, place, and time. No cranial nerve deficit. She exhibits normal muscle tone.  Skin: Skin is warm and dry. No rash noted. No erythema. No pallor.  Nursing note and vitals reviewed.   ED Course  Procedures (including critical care time) DIAGNOSTIC STUDIES: Oxygen Saturation is 96% on RA,  normal by my interpretation.    COORDINATION OF CARE: 3:57 AM Discussed treatment plan which includes lab work, CXR, EKG with pt at bedside and pt agreed to plan.  Labs Review Labs Reviewed  BASIC METABOLIC PANEL - Abnormal; Notable for the following:    Sodium 132 (*)    Potassium 2.8 (*)    CO2 21 (*)    Glucose, Bld 142 (*)    Calcium 8.2 (*)    All other components within normal limits  CBC - Abnormal; Notable for the following:    WBC 3.7 (*)    All other components within normal limits  HEPATIC FUNCTION PANEL  LIPASE, BLOOD  I-STAT TROPOININ, ED    Imaging Review Dg Chest 2 View  04/19/2015  CLINICAL DATA:  Acute onset of left-sided chest pain and cough. Initial encounter. EXAM: CHEST  2 VIEW COMPARISON:  Chest radiograph performed 06/24/2014 FINDINGS: The lungs are well-aerated and clear. There is no evidence of focal opacification, pleural effusion or pneumothorax. The heart is normal in size; the mediastinal contour is within normal limits. No acute osseous abnormalities are seen. Clips are noted within the right upper quadrant, reflecting prior cholecystectomy. IMPRESSION: No acute cardiopulmonary process seen. Electronically Signed   By: Roanna Raider M.D.   On: 04/19/2015 02:58   I have personally  reviewed and evaluated these images and lab results as part of my medical decision-making.   EKG Interpretation   Date/Time:  Wednesday April 19 2015 02:37:58 EST Ventricular Rate:  91 PR Interval:  142 QRS Duration: 74 QT Interval:  362 QTC Calculation: 445 R Axis:   68 Text Interpretation:  Normal sinus rhythm ST abnormality, possible  digitalis effect Abnormal ECG No significant change since last tracing  Confirmed by Erroll Luna [phone]) on 04/19/2015 3:48:25 AM      MDM   Final diagnoses:  None     patient presents emergency department for abdominal pain. Protocol orders for chest pain were placed including troponin and EKG which did not show any acute ischemia.  She states this has been going on for several years has never had a diagnosis. Previous CT scans did not show a cause. I do not believe patient warrants imaging.  She was given ranitidine and sucralfate for her symptoms. Potassium and magnesium were replaced.   upon repeat evaluation, patient's pain has improved. She is advised to take Zantac daily and sucralfate for breakthrough pain. Gastroenterology follow-up was provided. She appears well in no acute distress, vital signs were within her normal limits and she is safe for discharge.   I personally performed the services described in this documentation, which was scribed in my presence. The recorded information has been reviewed and is accurate.      Tomasita Crumble, MD 04/19/15 [phone]

## 2015-04-19 NOTE — Discharge Instructions (Signed)
Abdominal Pain, Adult Ms. [name], take ranitidine every day for your pain.  If it becomes severe, take sucralfate.  See a GI doctor within 3 days for close follow up. If symptoms worsen, come back to the ED immediately. Thank you. Many things can cause belly (abdominal) pain. Most times, the belly pain is not dangerous. Many cases of belly pain can be watched and treated at home. HOME CARE   Do not take medicines that help you go poop (laxatives) unless told to by your doctor.  Only take medicine as told by your doctor.  Eat or drink as told by your doctor. Your doctor will tell you if you should be on a special diet. GET HELP IF:  You do not know what is causing your belly pain.  You have belly pain while you are sick to your stomach (nauseous) or have runny poop (diarrhea).  You have pain while you pee or poop.  Your belly pain wakes you up at night.  You have belly pain that gets worse or better when you eat.  You have belly pain that gets worse when you eat fatty foods.  You have a fever. GET HELP RIGHT AWAY IF:   The pain does not go away within 2 hours.  You keep throwing up (vomiting).  The pain changes and is only in the right or left part of the belly.  You have bloody or tarry looking poop. MAKE SURE YOU:   Understand these instructions.  Will watch your condition.  Will get help right away if you are not doing well or get worse.   This information is not intended to replace advice given to you by your health care provider. Make sure you discuss any questions you have with your health care provider.   Document Released: 08/28/2007 Document Revised: 04/01/2014 Document Reviewed: 11/18/2012 Elsevier Interactive Patient Education [employer]

## 2015-04-19 NOTE — ED Notes (Signed)
Pt c/o left sided chest pain x 1 week. Has taken ibuprofen with minimal relief.

## 2015-08-18 ENCOUNTER — Emergency Department (HOSPITAL_COMMUNITY): Payer: Medicaid Other

## 2015-08-18 ENCOUNTER — Telehealth: Payer: Self-pay | Admitting: Gastroenterology

## 2015-08-18 ENCOUNTER — Encounter (HOSPITAL_COMMUNITY): Payer: Self-pay | Admitting: Emergency Medicine

## 2015-08-18 ENCOUNTER — Emergency Department (HOSPITAL_COMMUNITY)
Admission: EM | Admit: 2015-08-18 | Discharge: 2015-08-18 | Disposition: A | Discharge status: Home / Self Care | Payer: Medicaid Other | Attending: Emergency Medicine | Admitting: Emergency Medicine

## 2015-08-18 DIAGNOSIS — R0789 Other chest pain: Secondary | ICD-10-CM | POA: Insufficient documentation

## 2015-08-18 DIAGNOSIS — E669 Obesity, unspecified: Secondary | ICD-10-CM | POA: Diagnosis not present

## 2015-08-18 DIAGNOSIS — R1013 Epigastric pain: Secondary | ICD-10-CM | POA: Diagnosis not present

## 2015-08-18 DIAGNOSIS — R1012 Left upper quadrant pain: Secondary | ICD-10-CM | POA: Diagnosis not present

## 2015-08-18 DIAGNOSIS — Z79899 Other long term (current) drug therapy: Secondary | ICD-10-CM | POA: Insufficient documentation

## 2015-08-18 DIAGNOSIS — Z9851 Tubal ligation status: Secondary | ICD-10-CM | POA: Diagnosis not present

## 2015-08-18 DIAGNOSIS — Z9049 Acquired absence of other specified parts of digestive tract: Secondary | ICD-10-CM | POA: Insufficient documentation

## 2015-08-18 DIAGNOSIS — Z87891 Personal history of nicotine dependence: Secondary | ICD-10-CM | POA: Insufficient documentation

## 2015-08-18 DIAGNOSIS — M549 Dorsalgia, unspecified: Secondary | ICD-10-CM | POA: Diagnosis not present

## 2015-08-18 DIAGNOSIS — N644 Mastodynia: Secondary | ICD-10-CM | POA: Diagnosis present

## 2015-08-18 LAB — CBC WITH DIFFERENTIAL/PLATELET
BASOS ABS: 0 10*3/uL (ref 0.0–0.1)
Basophils Relative: 0 %
Eosinophils Absolute: 0.1 10*3/uL (ref 0.0–0.7)
Eosinophils Relative: 2 %
HCT: 38.4 % (ref 36.0–46.0)
HEMOGLOBIN: 12.6 g/dL (ref 12.0–15.0)
LYMPHS ABS: 2.4 10*3/uL (ref 0.7–4.0)
LYMPHS PCT: 42 %
MCH: 32.5 pg (ref 26.0–34.0)
MCHC: 32.8 g/dL (ref 30.0–36.0)
MCV: 99 fL (ref 78.0–100.0)
Monocytes Absolute: 0.3 10*3/uL (ref 0.1–1.0)
Monocytes Relative: 5 %
NEUTROS PCT: 51 %
Neutro Abs: 3 10*3/uL (ref 1.7–7.7)
PLATELETS: 319 10*3/uL (ref 150–400)
RBC: 3.88 MIL/uL (ref 3.87–5.11)
RDW: 11.6 % (ref 11.5–15.5)
WBC: 5.8 10*3/uL (ref 4.0–10.5)

## 2015-08-18 LAB — I-STAT TROPONIN, ED: Troponin i, poc: 0 ng/mL (ref 0.00–0.08)

## 2015-08-18 LAB — COMPREHENSIVE METABOLIC PANEL
ALT: 15 U/L (ref 14–54)
AST: 20 U/L (ref 15–41)
Albumin: 3 g/dL — ABNORMAL LOW (ref 3.5–5.0)
Alkaline Phosphatase: 58 U/L (ref 38–126)
Anion gap: 6 (ref 5–15)
BILIRUBIN TOTAL: 0.4 mg/dL (ref 0.3–1.2)
BUN: 9 mg/dL (ref 6–20)
CO2: 27 mmol/L (ref 22–32)
CREATININE: 0.79 mg/dL (ref 0.44–1.00)
Calcium: 8.3 mg/dL — ABNORMAL LOW (ref 8.9–10.3)
Chloride: 103 mmol/L (ref 101–111)
GFR calc Af Amer: >60 mL/min (ref >60)
Glucose, Bld: 125 mg/dL — ABNORMAL HIGH (ref 65–99)
Potassium: 3.7 mmol/L (ref 3.5–5.1)
Sodium: 136 mmol/L (ref 135–145)
TOTAL PROTEIN: 6.5 g/dL (ref 6.5–8.1)

## 2015-08-18 LAB — LIPASE, BLOOD: Lipase: 23 U/L (ref 11–51)

## 2015-08-18 LAB — PREGNANCY, URINE: PREG TEST UR: NEGATIVE

## 2015-08-18 MED ORDER — SUCRALFATE 1 G PO TABS: 1.0000 g | ORAL_TABLET | Freq: Three times a day (TID) | ORAL | Status: DC

## 2015-08-18 MED ORDER — SUCRALFATE 1 G PO TABS
1.0000 g | ORAL_TABLET | Freq: Once | ORAL | Status: AC
  Administered 2015-08-18: 1 g via ORAL
  Filled 2015-08-18: qty 1

## 2015-08-18 MED ORDER — GI COCKTAIL ~~LOC~~
30.0000 mL | Freq: Once | ORAL | Status: AC
  Administered 2015-08-18: 30 mL via ORAL
  Filled 2015-08-18: qty 30

## 2015-08-18 MED ORDER — FAMOTIDINE 20 MG PO TABS: 20.0000 mg | ORAL_TABLET | Freq: Two times a day (BID) | ORAL | Status: DC

## 2015-08-18 MED ORDER — FAMOTIDINE 20 MG PO TABS
20.0000 mg | ORAL_TABLET | Freq: Once | ORAL | Status: AC
  Administered 2015-08-18: 20 mg via ORAL
  Filled 2015-08-18: qty 1

## 2015-08-18 NOTE — Telephone Encounter (Signed)
Home number has been disconnected Left message on machine to call back on mobile number

## 2015-08-18 NOTE — ED Notes (Signed)
Pt presents from home with spouse for LEFT sided breast pain and LEFT sided thoracic region spinal pain; pt denies any new injury; pt reports pain is chronic but comes and goes; ibuprofen does not help; when RN asked "what brings you to the ER this morning?" pt replied, "same thing Im always here for"

## 2015-08-18 NOTE — Discharge Instructions (Signed)
We suspect that you may have pain from ulcer or gastritis (inflammation of the stomach). STOP taking ibuprofen as it can make your pain worse. Please follow-up with the gastroenterologist for further work-up and management. Take medications as prescribed. Return for worsening symptoms, including vomiting and unable to keep down food/fluids, fever, worsening pain, or any other symptoms concerning to you.  Abdominal Pain, Adult Many things can cause abdominal pain. Usually, abdominal pain is not caused by a disease and will improve without treatment. It can often be observed and treated at home. Your health care provider will do a physical exam and possibly order blood tests and X-rays to help determine the seriousness of your pain. However, in many cases, more time must pass before a clear cause of the pain can be found. Before that point, your health care provider may not know if you need more testing or further treatment. HOME CARE INSTRUCTIONS Monitor your abdominal pain for any changes. The following actions may help to alleviate any discomfort you are experiencing:  Only take over-the-counter or prescription medicines as directed by your health care provider.  Do not take laxatives unless directed to do so by your health care provider.  Try a clear liquid diet (broth, tea, or water) as directed by your health care provider. Slowly move to a bland diet as tolerated. SEEK MEDICAL CARE IF:  You have unexplained abdominal pain.  You have abdominal pain associated with nausea or diarrhea.  You have pain when you urinate or have a bowel movement.  You experience abdominal pain that wakes you in the night.  You have abdominal pain that is worsened or improved by eating food.  You have abdominal pain that is worsened with eating fatty foods.  You have a fever. SEEK IMMEDIATE MEDICAL CARE IF:  Your pain does not go away within 2 hours.  You keep throwing up (vomiting).  Your pain is felt  only in portions of the abdomen, such as the right side or the left lower portion of the abdomen.  You pass bloody or black tarry stools. MAKE SURE YOU:  Understand these instructions.  Will watch your condition.  Will get help right away if you are not doing well or get worse.   This information is not intended to replace advice given to you by your health care provider. Make sure you discuss any questions you have with your health care provider.   Document Released: 12/19/2004 Document Revised: 11/30/2014 Document Reviewed: 11/18/2012 Elsevier Interactive Patient Education [employer]

## 2015-08-18 NOTE — ED Provider Notes (Signed)
CSN: [id number]     Arrival [id number] 08/18/15  0400 History   First MD Initiated Contact with Patient 08/18/15 0414     Chief Complaint  Patient presents with  . Breast Pain  . Back Pain     (Consider location/radiation/quality/duration/timing/severity/associated sxs/prior Treatment) HPI 45 year old female who presents with abdominal pain and chest pain. She has a history of cholecystectomy and obesity. Reports that she has had long-standing left sided chest pain and abdominal pain. Has been evaluated here in the emergency department in the past for this. Last evaluated in January, and was referred to GI for potential endoscopy. States that she never followed up with this as her pain seemed to improve with Carafate and Pepcid. States that for the past week she has had worsening left upper quadrant abdominal pain that radiates into the left breast and to the back. Associated with nausea. He is unsure if it is associated with eating. Has not had fever, chills, vomiting, melena, diarrhea, dysuria, difficulty breathing, cough. States that she has been taking ibuprofen during this period of time which did not make her symptoms better. Past Medical History  Diagnosis Date  . No pertinent past medical history   . Obesity (BMI 30-39.9)   . Gallstones    Past Surgical History  Procedure Laterality Date  . Tubal ligation    . Cholecystectomy  02/20/2011    Procedure: LAPAROSCOPIC CHOLECYSTECTOMY WITH INTRAOPERATIVE CHOLANGIOGRAM;  Surgeon: Kandis Cocking, MD;  Location: [facility] OR;  Service: General;  Laterality: N/A;   Family History  Problem Relation Age of Onset  . Diabetes Paternal Grandmother   . Diabetes Paternal Grandfather   . Diabetes Maternal Grandmother   . Diabetes Maternal Grandfather   . Diabetes Mother   . Heart disease Mother   . Diabetes Father   . Heart disease Father   . Colon cancer Neg Hx   . Rectal cancer Neg Hx   . Stomach cancer Neg Hx   . Esophageal cancer Neg Hx     Social History  Substance Use Topics  . Smoking status: Former Smoker    Quit date: 03/11/2011  . Smokeless tobacco: Current User  . Alcohol Use: No   OB History    No data available     Review of Systems 10/14 systems reviewed and are negative other than those stated in the HPI  Allergies  Review of patient's allergies indicates no known allergies.  Home Medications   Prior to Admission medications   Medication Sig Start Date End Date Taking? Authorizing Provider  famotidine (PEPCID) 20 MG tablet Take 1 tablet (20 mg total) by mouth 2 (two) times daily. 08/18/15   Lavera Guise, MD  sucralfate (CARAFATE) 1 g tablet Take 1 tablet (1 g total) by mouth 4 (four) times daily -  with meals and at bedtime. 08/18/15   Lavera Guise, MD   BP 133/71 mmHg  Pulse 89  Temp(Src) 99 F (37.2 C) (Oral)  Resp 20  SpO2 97% Physical Exam Physical Exam  Nursing note and vitals reviewed. Constitutional: Well developed, well nourished, non-toxic, and in no acute distress Head: Normocephalic and atraumatic.  Mouth/Throat: Oropharynx is clear and moist.  Neck: Normal range of motion. Neck supple.  Cardiovascular: Normal rate and regular rhythm.   Pulmonary/Chest: Effort normal and breath sounds normal.  Abdominal: Soft. There is epigastric and LUQ tenderness. There is no rebound and no guarding.  Musculoskeletal: Normal range of motion.  Neurological: Alert, no facial  droop, fluent speech, moves all extremities symmetrically Skin: Skin is warm and dry.  Psychiatric: Cooperative    ED Course  Procedures (including critical care time) Labs Review Labs Reviewed  COMPREHENSIVE METABOLIC PANEL - Abnormal; Notable for the following:    Glucose, Bld 125 (*)    Calcium 8.3 (*)    Albumin 3.0 (*)    All other components within normal limits  CBC WITH DIFFERENTIAL/PLATELET  LIPASE, BLOOD  PREGNANCY, URINE  I-STAT TROPOININ, ED    Imaging Review Dg Chest 2 View  08/18/2015  CLINICAL  DATA:  LEFT chest pain and shortness of breath for 1 week. EXAM: CHEST  2 VIEW COMPARISON:  Chest radiograph April 19, 2015 FINDINGS: Cardiomediastinal silhouette is normal. The lungs are clear without pleural effusions or focal consolidations. Trachea projects midline and there is no pneumothorax. Soft tissue planes and included osseous structures are non-suspicious. Large body habitus. Surgical clips in the included right abdomen compatible with cholecystectomy. IMPRESSION: No acute cardiopulmonary process. Electronically Signed   By: Awilda Metroourtnay  Bloomer M.D.   On: 08/18/2015 06:47   I have personally reviewed and evaluated these images and lab results as part of my medical decision-making.   EKG Interpretation   Date/Time:  Friday Aug 18 2015 05:35:08 EDT Ventricular Rate:  82 PR Interval:  142 QRS Duration: 85 QT Interval:  383 QTC Calculation: 447 R Axis:   57 Text Interpretation:  Sinus rhythm No acute ischemic chagnes. No  significant change from prior EKG  Confirmed by Henriette Hesser MD, Claudio Mondry [phone]([id number]) on  08/18/2015 6:00:05 AM      MDM   Final diagnoses:  Left upper quadrant pain  Epigastric abdominal pain  Atypical chest pain   45 year old female who presents with worsening left upper quadrant abdominal pain that radiates into the chest. This is long-standing for her, worsened over the past week. Her vital signs are within normal limits. She is nontoxic and in no acute distress. Was soft and benign abdomen and pain in the epigastrium and left upper quadrant. Cardiopulmonary exam is unremarkable. She has had prior workup for this pain in the past in the ED on multiple occasions, including negative CT ab/pel. I suspect that her pain is likely GI related, potential ulcer versus gastritis especially in the setting of taking anti-inflammatory medications. She did receive Carafate, Pepcid and GI cocktail with some improvement. Basic blood work including CBC, comp metabolic panel, lipase, pregnancy  test are unremarkable. EKG without acute ischemic changes, negative troponin, and unremarkable chest x-ray. Symptoms not concerning for ACS, PE, dissection or other serious intrathoracic/cardiopulmonary process. Patient to continue Carafate and Pepcid at home and GI referral given for further workup and management as needed. Strict return and follow-up instructions reviewed. She expressed understanding of all discharge instructions and felt comfortable with the plan of care.     Lavera Guiseana Duo Raylene Carmickle, MD 08/18/15 [phone]

## 2015-08-22 NOTE — Telephone Encounter (Signed)
Home phone disconnected and no answer on alternate number.  I will wait for further communication from the pt

## 2015-09-27 ENCOUNTER — Encounter (HOSPITAL_COMMUNITY): Payer: Self-pay | Admitting: Emergency Medicine

## 2015-09-27 ENCOUNTER — Emergency Department (HOSPITAL_COMMUNITY)
Admission: EM | Admit: 2015-09-27 | Discharge: 2015-09-27 | Disposition: A | Discharge status: Home / Self Care | Payer: Medicaid Other | Attending: Emergency Medicine | Admitting: Emergency Medicine

## 2015-09-27 DIAGNOSIS — Z87891 Personal history of nicotine dependence: Secondary | ICD-10-CM | POA: Diagnosis not present

## 2015-09-27 DIAGNOSIS — R220 Localized swelling, mass and lump, head: Secondary | ICD-10-CM | POA: Diagnosis not present

## 2015-09-27 MED ORDER — OXYCODONE HCL 5 MG PO TABS
5.0000 mg | ORAL_TABLET | Freq: Once | ORAL | Status: AC
  Administered 2015-09-27: 5 mg via ORAL
  Filled 2015-09-27: qty 1

## 2015-09-27 MED ORDER — IBUPROFEN 800 MG PO TABS
800.0000 mg | ORAL_TABLET | Freq: Once | ORAL | Status: AC
  Administered 2015-09-27: 800 mg via ORAL
  Filled 2015-09-27: qty 1

## 2015-09-27 MED ORDER — ACETAMINOPHEN 500 MG PO TABS
1000.0000 mg | ORAL_TABLET | Freq: Once | ORAL | Status: AC
  Administered 2015-09-27: 1000 mg via ORAL
  Filled 2015-09-27: qty 2

## 2015-09-27 NOTE — ED Provider Notes (Signed)
CSN: [id number]     Arrival date & time 09/27/15  0636 History   First MD Initiated Contact with Patient 09/27/15 [phone]     Chief Complaint  Patient presents with  . Facial Swelling     (Consider location/radiation/quality/duration/timing/severity/associated sxs/prior Treatment) Patient is a 45 y.o. female presenting with general illness. The history is provided by the patient and the spouse.  Illness Severity:  Mild Onset quality:  Gradual Duration:  3 days Timing:  Constant Progression:  Worsening Chronicity:  New Associated symptoms: no chest pain, no congestion, no fever, no headaches, no myalgias, no nausea, no rhinorrhea, no shortness of breath, no vomiting and no wheezing    45 yo F With a chief complaints of right-sided facial swelling and pain. This is at the angle of the jaw. Going on for 3 days. Patient denies any fevers or chills denies any injury. Denies any intraoral tenderness. Denies difficulty with swallowing denies nausea vomiting or diarrhea.  Past Medical History  Diagnosis Date  . No pertinent past medical history   . Obesity (BMI 30-39.9)   . Gallstones    Past Surgical History  Procedure Laterality Date  . Tubal ligation    . Cholecystectomy  02/20/2011    Procedure: LAPAROSCOPIC CHOLECYSTECTOMY WITH INTRAOPERATIVE CHOLANGIOGRAM;  Surgeon: Kandis Cockingavid H Newman, MD;  [facility];  Service: General;  Laterality: N/A;   Family History  Problem Relation Age of Onset  . Diabetes Paternal Grandmother   . Diabetes Paternal Grandfather   . Diabetes Maternal Grandmother   . Diabetes Maternal Grandfather   . Diabetes Mother   . Heart disease Mother   . Diabetes Father   . Heart disease Father   . Colon cancer Neg Hx   . Rectal cancer Neg Hx   . Stomach cancer Neg Hx   . Esophageal cancer Neg Hx    Social History  Substance Use Topics  . Smoking status: Former Smoker    Quit date: 03/11/2011  . Smokeless tobacco: Current User  . Alcohol Use: No   OB  History    No data available     Review of Systems  Constitutional: Negative for fever and chills.  HENT: Positive for facial swelling. Negative for congestion and rhinorrhea.   Eyes: Negative for redness and visual disturbance.  Respiratory: Negative for shortness of breath and wheezing.   Cardiovascular: Negative for chest pain and palpitations.  Gastrointestinal: Negative for nausea and vomiting.  Genitourinary: Negative for dysuria and urgency.  Musculoskeletal: Negative for myalgias and arthralgias.  Skin: Negative for pallor and wound.  Neurological: Negative for dizziness and headaches.      Allergies  Review of patient's allergies indicates no known allergies.  Home Medications   Prior to Admission medications   Medication Sig Start Date End Date Taking? Authorizing Provider  famotidine (PEPCID) 20 MG tablet Take 1 tablet (20 mg total) by mouth 2 (two) times daily. 08/18/15   Lavera Guiseana Duo Liu, MD  sucralfate (CARAFATE) 1 g tablet Take 1 tablet (1 g total) by mouth 4 (four) times daily -  with meals and at bedtime. 08/18/15   Lavera Guiseana Duo Liu, MD   BP 127/84 mmHg  Pulse 77  Temp(Src) 98.2 F (36.8 C) (Oral)  Resp 16  Ht 5' (1.524 m)  Wt 217 lb (98.431 kg)  BMI 42.38 kg/m2  SpO2 100%  LMP 09/26/2015 Physical Exam  Constitutional: She is oriented to person, place, and time. She appears well-developed and well-nourished. No distress.  HENT:  Head: Normocephalic and atraumatic.    Eyes: EOM are normal. Pupils are equal, round, and reactive to light.  Neck: Normal range of motion. Neck supple.  Cardiovascular: Normal rate and regular rhythm.  Exam reveals no gallop and no friction rub.   No murmur heard. Pulmonary/Chest: Effort normal. She has no wheezes. She has no rales.  Abdominal: Soft. She exhibits no distension. There is no tenderness.  Musculoskeletal: She exhibits no edema or tenderness.  Neurological: She is alert and oriented to person, place, and time.  Skin:  Skin is warm and dry. She is not diaphoretic.  Psychiatric: She has a normal mood and affect. Her behavior is normal.  Nursing note and vitals reviewed.   ED Course  Procedures (including critical care time) Labs Review Labs Reviewed - No data to display  Imaging Review No results found. I have personally reviewed and evaluated these images and lab results as part of my medical decision-making.   EKG Interpretation None      MDM   Final diagnoses:  Facial swelling    45 yo F with a chief complaint of right-sided facial swelling. There is no noted erythema and warmth there is no fluctuance. This is in the area of the salivary gland. Feel this is most likely a salivary duct stone. Will have the patient follow-up with her family doctor.  7:32 AM:  I have discussed the diagnosis/risks/treatment options with the patient and family and believe the pt to be eligible for discharge home to follow-up with PCP. We also discussed returning to the ED immediately if new or worsening sx occur. We discussed the sx which are most concerning (e.g., sudden worsening pain, fever, inability to tolerate by mouth) that necessitate immediate return. Medications administered to the patient during their visit and any new prescriptions provided to the patient are listed below.  Medications given during this visit Medications  acetaminophen (TYLENOL) tablet 1,000 mg (not administered)  ibuprofen (ADVIL,MOTRIN) tablet 800 mg (not administered)  oxyCODONE (Oxy IR/ROXICODONE) immediate release tablet 5 mg (not administered)    New Prescriptions   No medications on file    The patient appears reasonably screen and/or stabilized for discharge and I doubt any other medical condition or other [facility] requiring further screening, evaluation, or treatment in the ED at this time prior to discharge.       Melene Planan Shirrell Solinger, DO 09/27/15 [id number]

## 2015-09-27 NOTE — ED Notes (Signed)
Pt unsure if she has had immunizations--MMR,  

## 2015-09-27 NOTE — ED Notes (Signed)
Pt. reports right lateral facial/neck swelling onset 3 days ago , denies injury , no fever or chills , airway intact /respirations unlabored with no oral swelling .

## 2015-09-27 NOTE — Discharge Instructions (Signed)
Take 4 over the counter ibuprofen tablets 3 times a day or 2 over-the-counter naproxen tablets twice a day for pain. Also take tylenol 1000mg(2 extra strength) four times a day.    

## 2015-12-13 ENCOUNTER — Emergency Department (HOSPITAL_COMMUNITY): Payer: Medicaid Other

## 2015-12-13 ENCOUNTER — Encounter (HOSPITAL_COMMUNITY): Payer: Self-pay | Admitting: Emergency Medicine

## 2015-12-13 DIAGNOSIS — R0789 Other chest pain: Secondary | ICD-10-CM | POA: Insufficient documentation

## 2015-12-13 DIAGNOSIS — Z87891 Personal history of nicotine dependence: Secondary | ICD-10-CM | POA: Insufficient documentation

## 2015-12-13 LAB — I-STAT TROPONIN, ED: TROPONIN I, POC: 0 ng/mL (ref 0.00–0.08)

## 2015-12-13 NOTE — ED Triage Notes (Signed)
Pt reports chest pain ongoing x2 days, states hard to breath. Denies nausea, diaphoresis. States has had this type of pain in he past and been seen here for. Described as sharp and also numb.

## 2015-12-14 ENCOUNTER — Emergency Department (HOSPITAL_COMMUNITY)
Admission: EM | Admit: 2015-12-14 | Discharge: 2015-12-14 | Disposition: A | Discharge status: Home / Self Care | Payer: Medicaid Other | Attending: Emergency Medicine | Admitting: Emergency Medicine

## 2015-12-14 DIAGNOSIS — R0789 Other chest pain: Secondary | ICD-10-CM

## 2015-12-14 LAB — CBC
HCT: 39.8 % (ref 36.0–46.0)
HEMOGLOBIN: 13.2 g/dL (ref 12.0–15.0)
MCH: 33.2 pg (ref 26.0–34.0)
MCHC: 33.2 g/dL (ref 30.0–36.0)
MCV: 100 fL (ref 78.0–100.0)
PLATELETS: 372 10*3/uL (ref 150–400)
RBC: 3.98 MIL/uL (ref 3.87–5.11)
RDW: 11.7 % (ref 11.5–15.5)
WBC: 6.6 10*3/uL (ref 4.0–10.5)

## 2015-12-14 LAB — LIPASE, BLOOD: Lipase: 23 U/L (ref 11–51)

## 2015-12-14 LAB — HEPATIC FUNCTION PANEL
ALK PHOS: 66 U/L (ref 38–126)
ALT: 16 U/L (ref 14–54)
AST: 25 U/L (ref 15–41)
Albumin: 3.3 g/dL — ABNORMAL LOW (ref 3.5–5.0)
BILIRUBIN TOTAL: 0.3 mg/dL (ref 0.3–1.2)
Total Protein: 7.3 g/dL (ref 6.5–8.1)

## 2015-12-14 LAB — BASIC METABOLIC PANEL
Anion gap: 9 (ref 5–15)
BUN: 7 mg/dL (ref 6–20)
CALCIUM: 8.6 mg/dL — AB (ref 8.9–10.3)
CO2: 24 mmol/L (ref 22–32)
CREATININE: 0.69 mg/dL (ref 0.44–1.00)
Chloride: 106 mmol/L (ref 101–111)
GFR calc Af Amer: >60 mL/min (ref >60)
GFR calc non Af Amer: >60 mL/min (ref >60)
GLUCOSE: 97 mg/dL (ref 65–99)
Potassium: 4.1 mmol/L (ref 3.5–5.1)
Sodium: 139 mmol/L (ref 135–145)

## 2015-12-14 LAB — I-STAT TROPONIN, ED: TROPONIN I, POC: 0.01 ng/mL (ref 0.00–0.08)

## 2015-12-14 MED ORDER — KETOROLAC TROMETHAMINE 60 MG/2ML IM SOLN
60.0000 mg | Freq: Once | INTRAMUSCULAR | Status: AC
  Administered 2015-12-14: 60 mg via INTRAMUSCULAR
  Filled 2015-12-14: qty 2

## 2015-12-14 MED ORDER — MELOXICAM 15 MG PO TABS: 15.0000 mg | ORAL_TABLET | Freq: Every day | ORAL | 0 refills | Status: DC

## 2015-12-14 NOTE — ED Provider Notes (Signed)
[facility]-EM[facility] DE[facility] Provider Note   CSN: [id number] Arrival date & time: 12/13/15  2304     History   Chief Complaint Chief Complaint  Patient presents with  . Chest Pain    HPI [name] is a 45 y.o. female.  HPI [name] is a 45 y.o. female presents to emergency department complaining of left-sided chest pain. Patient states she has had this chest pain for "years" on and off. She reports this time pain started 3 days ago. It has been constant since it started. Nothing is making pain better or worse. She reports associated shortness of breath. Denies dizziness or lightheadedness. Denies any cough. No pain with deep inspiration. Denies any recent travel or surgeries. Denies any pain or swelling to her legs. Although she states "since sitting in the waiting 7 hours everything hurts." She reports prior visits for the same, and states no one can figure this out. She has had workup including endoscopy, multiple x-rays, CT. She states each time her come here for pain.  Past Medical History:  Diagnosis Date  . Gallstones   . No pertinent past medical history   . Obesity (BMI 30-39.9)     Patient Active Problem List   Diagnosis Date Noted  . LUQ abdominal pain 12/14/2011  . Cholecystitis, acute with cholelithiasis 02/20/2011  . Obesity (BMI 30-39.9) 02/20/2011  . UTI (lower urinary tract infection) 02/20/2011    Past Surgical History:  Procedure Laterality Date  . CHOLECYSTECTOMY  02/20/2011   Procedure: LAPAROSCOPIC CHOLECYSTECTOMY WITH INTRAOPERATIVE CHOLANGIOGRAM;  Surgeon: Kandis Cocking, MD;  Location: [facility] OR;  Service: General;  Laterality: N/A;  . TUBAL LIGATION      OB History    No data available       Home Medications    Prior to Admission medications   Medication Sig Start Date End Date Taking? Authorizing Provider  naproxen sodium (ANAPROX) 220 MG tablet Take 220 mg by mouth 2 (two) times daily as needed (for pain).   Yes Historical Provider, MD    famotidine (PEPCID) 20 MG tablet Take 1 tablet (20 mg total) by mouth 2 (two) times daily. Patient not taking: Reported on 12/14/2015 08/18/15   Lavera Guise, MD  sucralfate (CARAFATE) 1 g tablet Take 1 tablet (1 g total) by mouth 4 (four) times daily -  with meals and at bedtime. Patient not taking: Reported on 12/14/2015 08/18/15   Lavera Guise, MD    Family History Family History  Problem Relation Age of Onset  . Diabetes Mother   . Heart disease Mother   . Diabetes Father   . Heart disease Father   . Diabetes Paternal Grandmother   . Diabetes Paternal Grandfather   . Diabetes Maternal Grandmother   . Diabetes Maternal Grandfather   . Colon cancer Neg Hx   . Rectal cancer Neg Hx   . Stomach cancer Neg Hx   . Esophageal cancer Neg Hx     Social History Social History  Substance Use Topics  . Smoking status: Former Smoker    Quit date: 03/11/2011  . Smokeless tobacco: Current User  . Alcohol use No     Allergies   Review of patient's allergies indicates no known allergies.   Review of Systems Review of Systems  Constitutional: Negative for chills and fever.  Respiratory: Positive for chest tightness and shortness of breath. Negative for cough.   Cardiovascular: Positive for chest pain. Negative for palpitations and leg swelling.  Gastrointestinal: Negative for  abdominal pain, diarrhea, nausea and vomiting.  Genitourinary: Negative for dysuria, flank pain and pelvic pain.  Musculoskeletal: Negative for arthralgias, myalgias, neck pain and neck stiffness.  Skin: Negative for rash.  Neurological: Negative for dizziness, weakness and headaches.  All other systems reviewed and are negative.    Physical Exam Updated Vital Signs BP 111/77 (BP Location: Left Arm)   Pulse 70   Temp 97.5 F (36.4 C)   Resp 16   Ht 5\' 2"  (1.575 m)   Wt 96.6 kg   LMP 11/24/2015 (Within Days)   SpO2 99%   BMI 38.96 kg/m   Physical Exam  Constitutional: She appears well-developed  and well-nourished. No distress.  HENT:  Head: Normocephalic.  Eyes: Conjunctivae are normal.  Neck: Neck supple.  Cardiovascular: Normal rate, regular rhythm and normal heart sounds.   Pulmonary/Chest: Effort normal and breath sounds normal. No respiratory distress. She has no wheezes. She has no rales. She exhibits tenderness.  No breast tenderness. TTP over left lower ribs. No rashes, swelling, bruising noted.   Abdominal: Soft. Bowel sounds are normal. She exhibits no distension. There is no tenderness. There is no rebound and no guarding.  Musculoskeletal: She exhibits no edema.  Neurological: She is alert.  Skin: Skin is warm and dry.  Psychiatric: She has a normal mood and affect. Her behavior is normal.  Nursing note and vitals reviewed.    ED Treatments / Results  Labs (all labs ordered are listed, but only abnormal results are displayed) Labs Reviewed  BASIC METABOLIC PANEL - Abnormal; Notable for the following:       Result Value   Calcium 8.6 (*)    All other components within normal limits  CBC  LIPASE, BLOOD  HEPATIC FUNCTION PANEL  I-STAT TROPOININ, ED    EKG  EKG Interpretation None       Radiology Dg Chest 2 View  Result Date: 12/13/2015 CLINICAL DATA:  Acute onset of generalized chest pain and difficulty breathing. Initial encounter. EXAM: CHEST  2 VIEW COMPARISON:  Chest radiograph performed 08/18/2015 FINDINGS: The lungs are well-aerated and clear. There is no evidence of focal opacification, pleural effusion or pneumothorax. The heart is normal in size; the mediastinal contour is within normal limits. No acute osseous abnormalities are seen. Clips are noted within the right upper quadrant, reflecting prior cholecystectomy. IMPRESSION: No acute cardiopulmonary process seen. Electronically Signed   By: Roanna Raider M.D.   On: 12/13/2015 23:45    Procedures Procedures (including critical care time)  Medications Ordered in ED Medications  ketorolac  (TORADOL) injection 60 mg (60 mg Intramuscular Given 12/14/15 0750)     Initial Impression / Assessment and Plan / ED Course  I have reviewed the triage vital signs and the nursing notes.  Pertinent labs & imaging results that were available during my care of the patient were reviewed by me and considered in my medical decision making (see chart for details).  Clinical Course  Patient with left-sided chest wall tenderness, no abdominal tenderness, no cough, no fever. This is a recurrent symptom. History of the same. Pain is constant. Very atypical for ACS. Troponin is negative. Will recheck lipase and LFTs. We'll try pain management with Toradol. Patient is not tachycardic, hypoxic, tachypnea, doubt PE. She is PERC negative.   9:41 AM Delta troponin negative. Lipase LFTs are negative. Patient's pain is clearly reproduced with palpation of the left lower ribs. She has had multiple visits to the ED for the same in the  past, it was thought that her symptoms could be due to acid reflux, however she has no abdominal tenderness on my exam and her pain is over her ribs. We'll still advised to continue Pepcid and Carafate. Will treat with anti-inflammatory medications and follow-up with primary care doctor.  Final Clinical Impressions(s) / ED Diagnoses   Final diagnoses:  Chest wall pain    New Prescriptions New Prescriptions   No medications on file     Jaynie Crumbleatyana Isabel Freese, PA-C 12/14/15 [phone]    Gilda Creasehristopher J Pollina, MD 12/20/15 2352

## 2015-12-14 NOTE — Discharge Instructions (Signed)
Mobic as prescribed as needed for pain. Please follow up with family doctor. Make sure you are taking pepcid and carafate to help with acid reflux symptoms. Return if worsening.

## 2015-12-14 NOTE — ED Notes (Signed)
Pt sleeping, pain much better

## 2015-12-14 NOTE — ED Notes (Signed)
All mover chest pain worse under her left breast, hurts down left arm hurts to touch it  Denies any injury , has hurt for years comes and goes.  For 3 days it did not go away and she felt nauseated and sob

## 2016-03-11 DIAGNOSIS — Z87891 Personal history of nicotine dependence: Secondary | ICD-10-CM | POA: Insufficient documentation

## 2016-03-11 DIAGNOSIS — N390 Urinary tract infection, site not specified: Secondary | ICD-10-CM | POA: Insufficient documentation

## 2016-03-11 DIAGNOSIS — R109 Unspecified abdominal pain: Secondary | ICD-10-CM | POA: Diagnosis present

## 2016-03-12 ENCOUNTER — Emergency Department (HOSPITAL_COMMUNITY)
Admission: EM | Admit: 2016-03-12 | Discharge: 2016-03-12 | Disposition: A | Discharge status: Home / Self Care | Payer: Medicaid Other | Attending: Emergency Medicine | Admitting: Emergency Medicine

## 2016-03-12 ENCOUNTER — Encounter (HOSPITAL_COMMUNITY): Payer: Self-pay | Admitting: Emergency Medicine

## 2016-03-12 DIAGNOSIS — N390 Urinary tract infection, site not specified: Secondary | ICD-10-CM

## 2016-03-12 DIAGNOSIS — R1084 Generalized abdominal pain: Secondary | ICD-10-CM

## 2016-03-12 LAB — URINALYSIS, ROUTINE W REFLEX MICROSCOPIC
BILIRUBIN URINE: NEGATIVE
Glucose, UA: NEGATIVE mg/dL
KETONES UR: NEGATIVE mg/dL
NITRITE: NEGATIVE
Protein, ur: NEGATIVE mg/dL
SPECIFIC GRAVITY, URINE: 1.005 (ref 1.005–1.030)
pH: 7 (ref 5.0–8.0)

## 2016-03-12 LAB — CBC
HCT: 38.9 % (ref 36.0–46.0)
HEMOGLOBIN: 12.9 g/dL (ref 12.0–15.0)
MCH: 32.2 pg (ref 26.0–34.0)
MCHC: 33.2 g/dL (ref 30.0–36.0)
MCV: 97 fL (ref 78.0–100.0)
Platelets: 326 10*3/uL (ref 150–400)
RBC: 4.01 MIL/uL (ref 3.87–5.11)
RDW: 12 % (ref 11.5–15.5)
WBC: 5.6 10*3/uL (ref 4.0–10.5)

## 2016-03-12 LAB — COMPREHENSIVE METABOLIC PANEL
ALK PHOS: 61 U/L (ref 38–126)
ALT: 15 U/L (ref 14–54)
ANION GAP: 6 (ref 5–15)
AST: 19 U/L (ref 15–41)
Albumin: 3.3 g/dL — ABNORMAL LOW (ref 3.5–5.0)
BILIRUBIN TOTAL: 0.6 mg/dL (ref 0.3–1.2)
BUN: 10 mg/dL (ref 6–20)
CALCIUM: 8.9 mg/dL (ref 8.9–10.3)
CO2: 25 mmol/L (ref 22–32)
Chloride: 106 mmol/L (ref 101–111)
Creatinine, Ser: 0.69 mg/dL (ref 0.44–1.00)
GLUCOSE: 102 mg/dL — AB (ref 65–99)
Potassium: 3.6 mmol/L (ref 3.5–5.1)
Sodium: 137 mmol/L (ref 135–145)
TOTAL PROTEIN: 6.9 g/dL (ref 6.5–8.1)

## 2016-03-12 LAB — LIPASE, BLOOD: Lipase: 21 U/L (ref 11–51)

## 2016-03-12 LAB — POC URINE PREG, ED: PREG TEST UR: NEGATIVE

## 2016-03-12 MED ORDER — DICYCLOMINE HCL 20 MG PO TABS: 20.0000 mg | ORAL_TABLET | Freq: Two times a day (BID) | ORAL | 0 refills | Status: DC

## 2016-03-12 MED ORDER — HYDROCODONE-ACETAMINOPHEN 5-325 MG PO TABS
1.0000 | ORAL_TABLET | Freq: Once | ORAL | Status: AC
  Administered 2016-03-12: 1 via ORAL
  Filled 2016-03-12: qty 1

## 2016-03-12 MED ORDER — CEPHALEXIN 500 MG PO CAPS: 500.0000 mg | ORAL_CAPSULE | Freq: Four times a day (QID) | ORAL | 0 refills | Status: DC

## 2016-03-12 MED ORDER — CEPHALEXIN 250 MG PO CAPS
500.0000 mg | ORAL_CAPSULE | Freq: Once | ORAL | Status: AC
  Administered 2016-03-12: 500 mg via ORAL
  Filled 2016-03-12: qty 2

## 2016-03-12 NOTE — ED Triage Notes (Signed)
Pt having 9/10 generalized abd pain, denies n/v/d, no fever or chills.

## 2016-03-12 NOTE — ED Provider Notes (Signed)
[facility] Provider Note   CSN: [id number] Arrival date & time: 03/11/16  2358     History   Chief Complaint Chief Complaint  Patient presents with  . Abdominal Pain    HPI [name] is a 45 y.o. female.  Patient complains of generalized abdominal pain that started earlier today. She states "they haven't been able to tell me what's wrong". Current episode started today but she reports recurrent similar abdominal pain for "years". She states it is worse today. No nausea, vomiting, diarrhea, fever. No urinary symptoms, vaginal discharge or irregular menses. The pain affects her lower back and states that is "usual" for her. No SOB, cough.    The history is provided by the patient and the spouse. No language interpreter was used.  Abdominal Pain   Pertinent negatives include fever.    Past Medical History:  Diagnosis Date  . Gallstones   . No pertinent past medical history   . Obesity (BMI 30-39.9)     Patient Active Problem List   Diagnosis Date Noted  . LUQ abdominal pain 12/14/2011  . Cholecystitis, acute with cholelithiasis 02/20/2011  . Obesity (BMI 30-39.9) 02/20/2011  . UTI (lower urinary tract infection) 02/20/2011    Past Surgical History:  Procedure Laterality Date  . CHOLECYSTECTOMY  02/20/2011   Procedure: LAPAROSCOPIC CHOLECYSTECTOMY WITH INTRAOPERATIVE CHOLANGIOGRAM;  Surgeon: Kandis Cockingavid H Newman, MD;  Location: [facility] OR;  Service: General;  Laterality: N/A;  . TUBAL LIGATION      OB History    No data available       Home Medications    Prior to Admission medications   Medication Sig Start Date End Date Taking? Authorizing Provider  ibuprofen (ADVIL,MOTRIN) 200 MG tablet Take 600 mg by mouth every 6 (six) hours as needed for headache (or pain).   Yes Historical Provider, MD  famotidine (PEPCID) 20 MG tablet Take 1 tablet (20 mg total) by mouth 2 (two) times daily. Patient not taking: Reported on 12/14/2015 08/18/15   Lavera Guiseana Duo Liu, MD    meloxicam (MOBIC) 15 MG tablet Take 1 tablet (15 mg total) by mouth daily. Patient not taking: Reported on 03/12/2016 12/14/15   Tatyana Kirichenko, PA-C  sucralfate (CARAFATE) 1 g tablet Take 1 tablet (1 g total) by mouth 4 (four) times daily -  with meals and at bedtime. Patient not taking: Reported on 03/12/2016 08/18/15   Lavera Guiseana Duo Liu, MD    Family History Family History  Problem Relation Age of Onset  . Diabetes Mother   . Heart disease Mother   . Diabetes Father   . Heart disease Father   . Diabetes Paternal Grandmother   . Diabetes Paternal Grandfather   . Diabetes Maternal Grandmother   . Diabetes Maternal Grandfather   . Colon cancer Neg Hx   . Rectal cancer Neg Hx   . Stomach cancer Neg Hx   . Esophageal cancer Neg Hx     Social History Social History  Substance Use Topics  . Smoking status: Former Smoker    Quit date: 03/11/2011  . Smokeless tobacco: Current User  . Alcohol use No     Allergies   Patient has no known allergies.   Review of Systems Review of Systems  Constitutional: Negative for chills and fever.  HENT: Negative.   Respiratory: Negative.  Negative for shortness of breath.   Cardiovascular: Negative.  Negative for chest pain.  Gastrointestinal: Positive for abdominal pain.  Musculoskeletal: Negative.   Skin: Negative.  Neurological: Negative.      Physical Exam Updated Vital Signs BP 145/89 (BP Location: Right Arm)   Pulse 81   Temp 98 F (36.7 C) (Oral)   Resp 20   Ht 5\' 1"  (1.549 m)   Wt 103.6 kg   LMP 02/23/2016   SpO2 100%   BMI 43.16 kg/m   Physical Exam  Constitutional: She is oriented to person, place, and time. She appears well-developed and well-nourished.  HENT:  Head: Normocephalic.  Mouth/Throat: Oropharynx is clear and moist.  Neck: Normal range of motion. Neck supple.  Cardiovascular: Normal rate.   No murmur heard. Pulmonary/Chest: Effort normal. No respiratory distress.  Abdominal: Soft. Bowel sounds  are normal. There is tenderness. There is no rebound and no guarding.  Morbidly obese abdomen. Diffuse tenderness without peritonitis.  Musculoskeletal: Normal range of motion.  Neurological: She is alert and oriented to person, place, and time.  Skin: Skin is warm and dry. No rash noted.  Psychiatric: She has a normal mood and affect.     ED Treatments / Results  Labs (all labs ordered are listed, but only abnormal results are displayed) Labs Reviewed  COMPREHENSIVE METABOLIC PANEL - Abnormal; Notable for the following:       Result Value   Glucose, Bld 102 (*)    Albumin 3.3 (*)    All other components within normal limits  URINALYSIS, ROUTINE W REFLEX MICROSCOPIC - Abnormal; Notable for the following:    APPearance HAZY (*)    Hgb urine dipstick MODERATE (*)    Leukocytes, UA LARGE (*)    Bacteria, UA FEW (*)    Squamous Epithelial / LPF 0-5 (*)    All other components within normal limits  URINE CULTURE  LIPASE, BLOOD  CBC  POC URINE PREG, ED    EKG  EKG Interpretation None       Radiology No results found.  Procedures Procedures (including critical care time)  Medications Ordered in ED Medications - No data to display   Initial Impression / Assessment and Plan / ED Course  I have reviewed the triage vital signs and the nursing notes.  Pertinent labs & imaging results that were available during my care of the patient were reviewed by me and considered in my medical decision making (see chart for details).  Clinical Course     Patient presents for acute on chronic abdominal pain starting yesterday. No other symptoms. Discussed evidence UTI on lab testing, which would not explain chronic type pain that she describes tonight. Will refer to outpatient clinic for PCP concerns and treat UTI with abx. All questions answered. No unaddressed concerns.   Final Clinical Impressions(s) / ED Diagnoses   Final diagnoses:  None   1. UTI 2. Abdominal pain.  New  Prescriptions New Prescriptions   No medications on file     Elpidio AnisShari Cabella Kimm, PA-C 03/12/16 0524    Elpidio AnisShari Ellia Knowlton, PA-C 03/12/16 [id number]    Zadie Rhineonald Wickline, MD 03/12/16 [phone]

## 2016-03-13 LAB — URINE CULTURE: Culture: >=100000 — AB

## 2016-03-14 ENCOUNTER — Telehealth (HOSPITAL_BASED_OUTPATIENT_CLINIC_OR_DEPARTMENT_OTHER): Payer: Self-pay

## 2016-03-14 NOTE — Telephone Encounter (Signed)
Post ED Visit - Positive Culture Follow-up  Culture report reviewed by antimicrobial stewardship pharmacist:  []  Enzo BiNathan Batchelder, Pharm.D. []  Celedonio MiyamotoJeremy Frens, Pharm.D., BCPS [x]  Garvin FilaMike Maccia, Pharm.D. []  Georgina PillionElizabeth Martin, Pharm.D., BCPS []  [place] HillsMinh Pham, [address]Ph[address]D., BCPS, AAHIVP []  Estella HuskMichelle Turner, Pharm.D., BCPS, AAHIVP []  Tennis Mustassie Stewart, Pharm.D. []  Sherle Poeob Vincent, [address]Pharm[address]  Positive urine culture, >/= 100,000 colonies ->  Streptococcus Agalactiae Treated with Cephalexin, organism sensitive to the same and no further patient follow-up is required at this time.  Arvid RightClark, Effie Janoski Dorn 03/14/2016, 12:07 PM

## 2016-05-14 ENCOUNTER — Emergency Department (HOSPITAL_COMMUNITY): Payer: Medicaid Other

## 2016-05-14 ENCOUNTER — Emergency Department (HOSPITAL_COMMUNITY)
Admission: EM | Admit: 2016-05-14 | Discharge: 2016-05-14 | Disposition: A | Discharge status: Home / Self Care | Payer: Medicaid Other | Attending: Emergency Medicine | Admitting: Emergency Medicine

## 2016-05-14 ENCOUNTER — Encounter (HOSPITAL_COMMUNITY): Payer: Self-pay | Admitting: *Deleted

## 2016-05-14 DIAGNOSIS — Z87891 Personal history of nicotine dependence: Secondary | ICD-10-CM | POA: Insufficient documentation

## 2016-05-14 DIAGNOSIS — R079 Chest pain, unspecified: Secondary | ICD-10-CM | POA: Diagnosis present

## 2016-05-14 DIAGNOSIS — R0789 Other chest pain: Secondary | ICD-10-CM

## 2016-05-14 LAB — BASIC METABOLIC PANEL
Anion gap: 12 (ref 5–15)
BUN: 13 mg/dL (ref 6–20)
CALCIUM: 8.6 mg/dL — AB (ref 8.9–10.3)
CO2: 19 mmol/L — ABNORMAL LOW (ref 22–32)
CREATININE: 0.62 mg/dL (ref 0.44–1.00)
Chloride: 103 mmol/L (ref 101–111)
Glucose, Bld: 102 mg/dL — ABNORMAL HIGH (ref 65–99)
Potassium: 4.1 mmol/L (ref 3.5–5.1)
SODIUM: 134 mmol/L — AB (ref 135–145)

## 2016-05-14 LAB — CBC
HCT: 40.5 % (ref 36.0–46.0)
Hemoglobin: 13.5 g/dL (ref 12.0–15.0)
MCH: 31.8 pg (ref 26.0–34.0)
MCHC: 33.3 g/dL (ref 30.0–36.0)
MCV: 95.5 fL (ref 78.0–100.0)
PLATELETS: 322 10*3/uL (ref 150–400)
RBC: 4.24 MIL/uL (ref 3.87–5.11)
RDW: 12 % (ref 11.5–15.5)
WBC: 6.9 10*3/uL (ref 4.0–10.5)

## 2016-05-14 LAB — I-STAT TROPONIN, ED: TROPONIN I, POC: 0 ng/mL (ref 0.00–0.08)

## 2016-05-14 MED ORDER — KETOROLAC TROMETHAMINE 30 MG/ML IJ SOLN: 30.0000 mg | Freq: Once | INTRAMUSCULAR | Status: DC

## 2016-05-14 MED ORDER — CYCLOBENZAPRINE HCL 10 MG PO TABS: 10.0000 mg | ORAL_TABLET | Freq: Two times a day (BID) | ORAL | 0 refills | Status: DC | PRN

## 2016-05-14 MED ORDER — KETOROLAC TROMETHAMINE 30 MG/ML IJ SOLN
30.0000 mg | Freq: Once | INTRAMUSCULAR | Status: AC
  Administered 2016-05-14: 30 mg via INTRAMUSCULAR
  Filled 2016-05-14: qty 1

## 2016-05-14 NOTE — ED Notes (Signed)
Waiting for case management

## 2016-05-14 NOTE — ED Provider Notes (Signed)
[facility]-EMERGENCY DEPT Provider Note   CSN: [id number] Arrival date & time: 05/14/16  [id number]     History   Chief Complaint Chief Complaint  Patient presents with  . Chest Pain    HPI [name] is a 46 y.o. female.  Patient presents to the ED with a chief complaint of chest pain.  She states that she has had the pain for months.  She reports that it is worsened with movement, palpation, and coughing.  She denies any known injuries.  She denies any radiating pain, SOB, or productive cough.  She has not taken anything for her symptoms.  She reports having been seen here for this in the past.  She states that she has been unable to follow-up with a primary care provider.  She denies any other associated symptoms.     The history is provided by the patient. No language interpreter was used.    Past Medical History:  Diagnosis Date  . Gallstones   . No pertinent past medical history   . Obesity (BMI 30-39.9)     Patient Active Problem List   Diagnosis Date Noted  . LUQ abdominal pain 12/14/2011  . Cholecystitis, acute with cholelithiasis 02/20/2011  . Obesity (BMI 30-39.9) 02/20/2011  . UTI (lower urinary tract infection) 02/20/2011    Past Surgical History:  Procedure Laterality Date  . CHOLECYSTECTOMY  02/20/2011   Procedure: LAPAROSCOPIC CHOLECYSTECTOMY WITH INTRAOPERATIVE CHOLANGIOGRAM;  Surgeon: Kandis Cockingavid H Newman, MD;  Location: [facility] OR;  Service: General;  Laterality: N/A;  . TUBAL LIGATION      OB History    No data available       Home Medications    Prior to Admission medications   Medication Sig Start Date End Date Taking? Authorizing Provider  ibuprofen (ADVIL,MOTRIN) 200 MG tablet Take 600 mg by mouth every 6 (six) hours as needed for headache (or pain).   Yes Historical Provider, MD    Family History Family History  Problem Relation Age of Onset  . Diabetes Mother   . Heart disease Mother   . Diabetes Father   . Heart disease Father   . Diabetes  Paternal Grandmother   . Diabetes Paternal Grandfather   . Diabetes Maternal Grandmother   . Diabetes Maternal Grandfather   . Colon cancer Neg Hx   . Rectal cancer Neg Hx   . Stomach cancer Neg Hx   . Esophageal cancer Neg Hx     Social History Social History  Substance Use Topics  . Smoking status: Former Smoker    Quit date: 03/11/2011  . Smokeless tobacco: Current User  . Alcohol use No     Allergies   Patient has no known allergies.   Review of Systems Review of Systems  All other systems reviewed and are negative.    Physical Exam Updated Vital Signs BP 112/84   Pulse 74   Temp 98.2 F (36.8 C) (Oral)   Resp 21   Ht 5\' 2"  (1.575 m)   Wt 101.2 kg   SpO2 100%   BMI 40.79 kg/m   Physical Exam  Constitutional: She is oriented to person, place, and time. She appears well-developed and well-nourished.  HENT:  Head: Normocephalic and atraumatic.  Eyes: Conjunctivae and EOM are normal. Pupils are equal, round, and reactive to light.  Neck: Normal range of motion. Neck supple.  Cardiovascular: Normal rate and regular rhythm.  Exam reveals no gallop and no friction rub.   No murmur heard. Pulmonary/Chest: Effort  normal and breath sounds normal. No respiratory distress. She has no wheezes. She has no rales. She exhibits no tenderness.  Left chest wall is tender to palpation, no palpable deformity  Abdominal: Soft. Bowel sounds are normal. She exhibits no distension and no mass. There is no tenderness. There is no rebound and no guarding.  Musculoskeletal: Normal range of motion. She exhibits no edema or tenderness.  Neurological: She is alert and oriented to person, place, and time.  Skin: Skin is warm and dry.  Psychiatric: She has a normal mood and affect. Her behavior is normal. Judgment and thought content normal.  Nursing note and vitals reviewed.    ED Treatments / Results  Labs (all labs ordered are listed, but only abnormal results are  displayed) Labs Reviewed  BASIC METABOLIC PANEL - Abnormal; Notable for the following:       Result Value   Sodium 134 (*)    CO2 19 (*)    Glucose, Bld 102 (*)    Calcium 8.6 (*)    All other components within normal limits  CBC  I-STAT TROPOININ, ED    EKG  EKG Interpretation None       Radiology Dg Chest 2 View  Result Date: 05/14/2016 CLINICAL DATA:  Shortness of breath EXAM: CHEST  2 VIEW COMPARISON:  Chest radiograph 12/13/2015 FINDINGS: The lungs are well inflated. Cardiomediastinal contours are normal. No pneumothorax or pleural effusion. No focal airspace consolidation or pulmonary edema. IMPRESSION: Clear lungs. Electronically Signed   By: Deatra Robinson M.D.   On: 05/14/2016 06:21    Procedures Procedures (including critical care time)  Medications Ordered in ED Medications  ketorolac (TORADOL) 30 MG/ML injection 30 mg (not administered)     Initial Impression / Assessment and Plan / ED Course  I have reviewed the triage vital signs and the nursing notes.  Pertinent labs & imaging results that were available during my care of the patient were reviewed by me and considered in my medical decision making (see chart for details).     Patient with intermittent chest pain for months.  In reviewing prior notes, she says she has had this for years.  She has reassuring labs and CXR with a normal EKG.  Her pain is easily reproducible with palpation.  I doubt ACS or PE.   Plan for treatment with toradol and outpatient follow-up.  Final Clinical Impressions(s) / ED Diagnoses   Final diagnoses:  Chest wall pain    New Prescriptions New Prescriptions   No medications on file     Roxy Horseman, PA-C 05/14/16 6045    Tomasita Crumble, MD 05/14/16 1413

## 2016-05-14 NOTE — Discharge Planning (Signed)
EDCM contacted to assist with follow-up appointment.  Pt has Medicaid Insurance with an assigned PCP.  EDCM explained to pt that she must contact DSS to have PCP changed in order to be seen by another MD.  Pt verbalizes understanding. Brittnye Josephs J. Jenica Costilow, RN, BSN, NCM [phone]   

## 2016-05-14 NOTE — ED Triage Notes (Signed)
C/o left sided chest pain and left lower quad pain onset several weeks ago, no change today "she is tired of it"

## 2016-05-16 ENCOUNTER — Ambulatory Visit: Payer: Medicaid Other | Attending: Internal Medicine | Admitting: Physician Assistant

## 2016-05-16 VITALS — BP 118/63 | HR 69 | Temp 98.1°F | Resp 16 | Wt 225.8 lb

## 2016-05-16 DIAGNOSIS — Z6841 Body Mass Index (BMI) 40.0 and over, adult: Secondary | ICD-10-CM | POA: Insufficient documentation

## 2016-05-16 DIAGNOSIS — Z9049 Acquired absence of other specified parts of digestive tract: Secondary | ICD-10-CM | POA: Diagnosis not present

## 2016-05-16 DIAGNOSIS — E669 Obesity, unspecified: Secondary | ICD-10-CM | POA: Diagnosis not present

## 2016-05-16 DIAGNOSIS — R1012 Left upper quadrant pain: Secondary | ICD-10-CM | POA: Diagnosis not present

## 2016-05-16 DIAGNOSIS — R109 Unspecified abdominal pain: Secondary | ICD-10-CM | POA: Diagnosis present

## 2016-05-16 MED ORDER — OMEPRAZOLE 40 MG PO CPDR: 40.0000 mg | DELAYED_RELEASE_CAPSULE | Freq: Every day | ORAL | 0 refills | Status: DC

## 2016-05-16 NOTE — Progress Notes (Signed)
[name]  [id number]  [id number]  DOB - 07/18/1970  Chief Complaint  Patient presents with  . Follow-up  . Abdominal Pain       Subjective:   [name] is a 46 y.o. female here today for establishment of care. She has a history of cholecystitis and status post gallstone surgery. She has no other past medical history and does not take any routine medications. She's had multiple presentations over the last year to the emergency department with left upper quadrant left lower chest discomfort. It always has included a negative workup and thought to be musculoskeletal related. The last visit was 05/14/2016. At that time her troponin was negative. Her CBC and BMP were within normal limits. EKG showed sinus rhythm with nonspecific ST-T wave changes inferiorly laterally. Her chest x-ray was negative. She was treated with Toradol and given a prescription for Flexeril.  She did not have the Flexeril field. She continues to have left upper quadrant to epigastric discomfort. Not in her chest recently. Some nausea but no vomiting. Her belly feels like it swells which causes some shortness of breath at times. No difficulty urinating or hematuria. Her bowel movements are 2-3 times weekly and that is normal for her. She admits to spicy food intake.   ROS: GEN: denies fever or chills, denies change in weight Skin: denies lesions or rashes HEENT: denies headache, earache, epistaxis, sore throat, or neck pain LUNGS: denies SHOB, dyspnea, PND, orthopnea CV: denies CP or palpitations ABD: + abd pain, +N or V EXT: denies muscle spasms or swelling; no pain in lower ext, no weakness NEURO: denies numbness or tingling, denies sz, stroke or TIA  ALLERGIES: No Known Allergies  PAST MEDICAL HISTORY: Past Medical History:  Diagnosis Date  . Gallstones   . No pertinent past medical history   . Obesity (BMI 30-39.9)     PAST SURGICAL HISTORY: Past Surgical History:  Procedure  Laterality Date  . CHOLECYSTECTOMY  02/20/2011   Procedure: LAPAROSCOPIC CHOLECYSTECTOMY WITH INTRAOPERATIVE CHOLANGIOGRAM;  Surgeon: Kandis Cockingavid H Newman, MD;  Location: [facility] OR;  Service: General;  Laterality: N/A;  . TUBAL LIGATION      MEDICATIONS AT HOME: Prior to Admission medications   Medication Sig Start Date End Date Taking? Authorizing Provider  cyclobenzaprine (FLEXERIL) 10 MG tablet Take 1 tablet (10 mg total) by mouth 2 (two) times daily as needed for muscle spasms. Patient not taking: Reported on 05/16/2016 05/14/16   Roxy Horsemanobert Browning, PA-C  ibuprofen (ADVIL,MOTRIN) 200 MG tablet Take 600 mg by mouth every 6 (six) hours as needed for headache (or pain).    Historical Provider, MD  omeprazole (PRILOSEC) 40 MG capsule Take 1 capsule (40 mg total) by mouth daily. 05/16/16   Vivianne Masteriffany S Danta Baumgardner, PA-C     Objective:   Vitals:   05/16/16 0907  BP: 118/63  Pulse: 69  Resp: 16  Temp: 98.1 F (36.7 C)  TempSrc: Oral  SpO2: 99%  Weight: 225 lb 12.8 oz (102.4 kg)    Exam General appearance : Awake, alert, not in any distress. Speech Clear. Not toxic looking HEENT: Atraumatic and Normocephalic, pupils equally reactive to light and accomodation Neck: supple, no JVD. No cervical lymphadenopathy.  Chest:Good air entry bilaterally, no added sounds  CVS: S1 S2 regular, no murmurs.  Abdomen: Bowel sounds present, minimally tender tender and not distended with no guarding, rigidity or rebound. Extremities: B/L Lower Ext shows no edema, both legs are warm to touch Neurology: Awake alert, and oriented  X 3, CN II-XII intact, Non focal Skin:No Rash   Data Review Lab Results  Component Value Date   HGBA1C 5.2 12/17/2012     Assessment & Plan  1. LUQ abdominal pain ?etiology  -Trial of PPI  -Encouraged to get the Flexeril filled as well  -encouraged weight loss  -deferred imaging at this time, CT abd/pelvis in 2015 was WNL   Return in about 2 weeks (around 05/30/2016).  The patient was  given clear instructions to go to ER or return to medical center if symptoms don't improve, worsen or new problems develop. The patient verbalized understanding. The patient was told to call to get lab results if they haven't heard anything in the next week.   This note has been created with Education officer, environmental. Any transcriptional errors are unintentional.    Scot Jun, PA-C [facility] [place], [place] [phone]   05/16/2016, 9:17 AM

## 2016-05-16 NOTE — Patient Instructions (Signed)
I sent a prescription to your pharmacy. I would also like for you to get the medication that you already have (Flexeril) filled as well.

## 2016-05-30 ENCOUNTER — Ambulatory Visit (HOSPITAL_COMMUNITY)
Admission: RE | Admit: 2016-05-30 | Discharge: 2016-05-30 | Disposition: A | Discharge status: Home / Self Care | Payer: Medicaid Other | Source: Ambulatory Visit | Attending: Family Medicine | Admitting: Family Medicine

## 2016-05-30 ENCOUNTER — Ambulatory Visit: Payer: Medicaid Other | Attending: Family Medicine | Admitting: Family Medicine

## 2016-05-30 VITALS — BP 127/74 | HR 80 | Temp 98.0°F | Resp 18 | Ht 62.0 in | Wt 224.2 lb

## 2016-05-30 DIAGNOSIS — Z09 Encounter for follow-up examination after completed treatment for conditions other than malignant neoplasm: Secondary | ICD-10-CM | POA: Diagnosis not present

## 2016-05-30 DIAGNOSIS — G8929 Other chronic pain: Secondary | ICD-10-CM

## 2016-05-30 DIAGNOSIS — K219 Gastro-esophageal reflux disease without esophagitis: Secondary | ICD-10-CM | POA: Diagnosis not present

## 2016-05-30 DIAGNOSIS — M5442 Lumbago with sciatica, left side: Secondary | ICD-10-CM | POA: Insufficient documentation

## 2016-05-30 DIAGNOSIS — M62830 Muscle spasm of back: Secondary | ICD-10-CM

## 2016-05-30 DIAGNOSIS — M47896 Other spondylosis, lumbar region: Secondary | ICD-10-CM | POA: Insufficient documentation

## 2016-05-30 DIAGNOSIS — M5441 Lumbago with sciatica, right side: Secondary | ICD-10-CM | POA: Insufficient documentation

## 2016-05-30 DIAGNOSIS — R1032 Left lower quadrant pain: Secondary | ICD-10-CM | POA: Insufficient documentation

## 2016-05-30 DIAGNOSIS — R1013 Epigastric pain: Secondary | ICD-10-CM | POA: Diagnosis not present

## 2016-05-30 MED ORDER — OMEPRAZOLE 20 MG PO CPDR: 20.0000 mg | DELAYED_RELEASE_CAPSULE | Freq: Every day | ORAL | 2 refills | Status: DC

## 2016-05-30 MED ORDER — CYCLOBENZAPRINE HCL 10 MG PO TABS: 10.0000 mg | ORAL_TABLET | Freq: Three times a day (TID) | ORAL | 0 refills | Status: DC | PRN

## 2016-05-30 MED ORDER — MELOXICAM 7.5 MG PO TABS: 7.5000 mg | ORAL_TABLET | Freq: Every day | ORAL | 0 refills | Status: DC

## 2016-05-30 NOTE — Patient Instructions (Signed)
Food Choices for Gastroesophageal Reflux Disease, Adult When you have gastroesophageal reflux disease (GERD), the foods you eat and your eating habits are very important. Choosing the right foods can help ease your discomfort. What guidelines do I need to follow? Choose fruits, vegetables, whole grains, and low-fat dairy products. Choose low-fat meat, fish, and poultry. Limit fats such as oils, salad dressings, butter, nuts, and avocado. Keep a food diary. This helps you identify foods that cause symptoms. Avoid foods that cause symptoms. These may be different for everyone. Eat small meals often instead of 3 large meals a day. Eat your meals slowly, in a place where you are relaxed. Limit fried foods. Cook foods using methods other than frying. Avoid drinking alcohol. Avoid drinking large amounts of liquids with your meals. Avoid bending over or lying down until 2-3 hours after eating. What foods are not recommended? These are some foods and drinks that may make your symptoms worse: Vegetables  Tomatoes. Tomato juice. Tomato and spaghetti sauce. Chili peppers. Onion and garlic. Horseradish. Fruits  Oranges, grapefruit, and lemon (fruit and juice). Meats  High-fat meats, fish, and poultry. This includes hot dogs, ribs, ham, sausage, salami, and bacon. Dairy  Whole milk and chocolate milk. Sour cream. Cream. Butter. Ice cream. Cream cheese. Drinks  Coffee and tea. Bubbly (carbonated) drinks or energy drinks. Condiments  Hot sauce. Barbecue sauce. Sweets/Desserts  Chocolate and cocoa. Donuts. Peppermint and spearmint. Fats and Oils  High-fat foods. This includes [place] fries and potato chips. Other  Vinegar. Strong spices. This includes black pepper, white pepper, red pepper, cayenne, curry powder, cloves, ginger, and chili powder. The items listed above may not be a complete list of foods and drinks to avoid. Contact your dietitian for more information.  This information is not  intended to replace advice given to you by your health care provider. Make sure you discuss any questions you have with your health care provider. Document Released: 09/10/2011 Document Revised: 08/17/2015 Document Reviewed: 01/13/2013 Elsevier Interactive Patient Education  2017 [employer] Inc. Back Pain, Adult Back pain is very common. The pain often gets better over time. The cause of back pain is usually not dangerous. Most people can learn to manage their back pain on their own. Follow these instructions at home: Watch your back pain for any changes. The following actions may help to lessen any pain you are feeling:  Stay active. Start with short walks on flat ground if you can. Try to walk farther each day.  Exercise regularly as told by your doctor. Exercise helps your back heal faster. It also helps avoid future injury by keeping your muscles strong and flexible.  Do not sit, drive, or stand in one place for more than 30 minutes.  Do not stay in bed. Resting more than 1-2 days can slow down your recovery.  Be careful when you bend or lift an object. Use good form when lifting:  Bend at your knees.  Keep the object close to your body.  Do not twist.  Sleep on a firm mattress. Lie on your side, and bend your knees. If you lie on your back, put a pillow under your knees.  Take medicines only as told by your doctor.  Put ice on the injured area.  Put ice in a plastic bag.  Place a towel between your skin and the bag.  Leave the ice on for 20 minutes, 2-3 times a day for the first 2-3 days. After that, you can switch between ice  and heat packs.  Avoid feeling anxious or stressed. Find good ways to deal with stress, such as exercise.  Maintain a healthy weight. Extra weight puts stress on your back. Contact a doctor if:  You have pain that does not go away with rest or medicine.  You have worsening pain that goes down into your legs or buttocks.  You have pain that does  not get better in one week.  You have pain at night.  You lose weight.  You have a fever or chills. Get help right away if:  You cannot control when you poop (bowel movement) or pee (urinate).  Your arms or legs feel weak.  Your arms or legs lose feeling (numbness).  You feel sick to your stomach (nauseous) or throw up (vomit).  You have belly (abdominal) pain.  You feel like you may pass out (faint). This information is not intended to replace advice given to you by your health care provider. Make sure you discuss any questions you have with your health care provider. Document Released: 08/28/2007 Document Revised: 08/17/2015 Document Reviewed: 07/13/2013 Elsevier Interactive Patient Education  2017 [employer]

## 2016-05-30 NOTE — Progress Notes (Signed)
Subjective:  Patient ID: [name], female    DOB: 11-22-1970  Age: 46 y.o. MRN: [id number]  CC: Establish Care   HPI [name] presents for   Follow up LLQ pain: Trial of PPI was prescribed. Improvement of symptoms.   Follow up chest pain: History of ED visit , ACS and PE r/o.   Muscle strain: 1 year c/o muscle strain: No history of sifcant back injury. Paresthesias of the extremities. Nothing for muscle strain. Moderate pain,  5/10 most days.    Outpatient Medications Prior to Visit  Medication Sig Dispense Refill  . cyclobenzaprine (FLEXERIL) 10 MG tablet Take 1 tablet (10 mg total) by mouth 2 (two) times daily as needed for muscle spasms. (Patient not taking: Reported on 05/16/2016) 20 tablet 0  . ibuprofen (ADVIL,MOTRIN) 200 MG tablet Take 600 mg by mouth every 6 (six) hours as needed for headache (or pain).    Marland Kitchen omeprazole (PRILOSEC) 40 MG capsule Take 1 capsule (40 mg total) by mouth daily. 30 capsule 0   No facility-administered medications prior to visit.     ROS Review of Systems  Constitutional: Negative.   Respiratory: Negative.   Cardiovascular: Negative.   Gastrointestinal: Negative.   Musculoskeletal: Positive for back pain.  Neurological:       Paresthesias to BLE.    Objective:  BP 127/74 (BP Location: Left Arm, Patient Position: Sitting, Cuff Size: Normal)   Pulse 80   Temp 98 F (36.7 C) (Oral)   Resp 18   Ht 5\' 2"  (1.575 m)   Wt 224 lb 3.2 oz (101.7 kg)   LMP 05/22/2016   SpO2 98%   BMI 41.01 kg/m   BP/Weight 05/30/2016 05/16/2016 05/14/2016  Systolic BP 127 118 113  Diastolic BP 74 63 69  Wt. (Lbs) 224.2 225.8 223  BMI 41.01 41.3 40.79    Physical Exam  Constitutional: She is oriented to person, place, and time. She appears well-developed and well-nourished.  Obese  Cardiovascular: Normal rate, regular rhythm, normal heart sounds and intact distal pulses.   Pulmonary/Chest: Effort normal and breath sounds normal.  Abdominal:  Soft. Bowel sounds are normal. There is tenderness (epigastric).  Neurological: She is alert and oriented to person, place, and time. She has normal reflexes.  Skin: Skin is warm and dry.  Nursing note and vitals reviewed.   Assessment & Plan:   Problem List Items Addressed This Visit    None    Visit Diagnoses    Chronic bilateral low back pain with bilateral sciatica    -  Primary   Relevant Medications   cyclobenzaprine (FLEXERIL) 10 MG tablet   meloxicam (MOBIC) 7.5 MG tablet   Other Relevant Orders   DG Lumbar Spine Complete (Completed)   Muscle spasm of back       Relevant Medications   cyclobenzaprine (FLEXERIL) 10 MG tablet   meloxicam (MOBIC) 7.5 MG tablet   Other Relevant Orders   DG Lumbar Spine Complete (Completed)   Epigastric pain       Relevant Orders   H. pylori breath test   Gastroesophageal reflux disease, esophagitis presence not specified       Relevant Medications   omeprazole (PRILOSEC) 20 MG capsule   Other Relevant Orders   H. pylori breath test   Follow up          Meds ordered this encounter  Medications  . cyclobenzaprine (FLEXERIL) 10 MG tablet    Sig: Take 1 tablet (10 mg total)  by mouth 3 (three) times daily as needed for muscle spasms.    Dispense:  30 tablet    Refill:  0    Order Specific Question:   Supervising Provider    Answer:   Quentin AngstJEGEDE, OLUGBEMIGA E [id number]]  . omeprazole (PRILOSEC) 20 MG capsule    Sig: Take 1 capsule (20 mg total) by mouth daily.    Dispense:  30 capsule    Refill:  2    Order Specific Question:   Supervising Provider    Answer:   Quentin AngstJEGEDE, OLUGBEMIGA E [id number]]  . meloxicam (MOBIC) 7.5 MG tablet    Sig: Take 1 tablet (7.5 mg total) by mouth daily.    Dispense:  30 tablet    Refill:  0    Order Specific Question:   Supervising Provider    Answer:   Quentin AngstJEGEDE, OLUGBEMIGA E [id number]    Follow-up: Return if symptoms worsen or fail to improve. Return in about 2 months (around 07/30/2016), for GERD.   Lizbeth BarkMandesia  R Hairston FNP

## 2016-06-04 ENCOUNTER — Other Ambulatory Visit: Payer: Self-pay | Admitting: Family Medicine

## 2016-06-04 LAB — H. PYLORI BREATH TEST: H. PYLORI BREATH TEST: NOT DETECTED

## 2016-06-07 ENCOUNTER — Telehealth: Payer: Self-pay

## 2016-06-07 NOTE — Telephone Encounter (Signed)
-----   Message from Lizbeth BarkMandesia R Hairston, FNP sent at 06/04/2016  9:39 PM EDT ----- X-ray showed mild osteoarthritis.  H.pylori is negative. H.pylori is a bacteria that can infect the stomach and cause stomach ulcers.

## 2016-06-07 NOTE — Telephone Encounter (Signed)
CMA call to go over lab results  Patient did not answer but son answer the phone left a message stating that [facility] & to call me back for her results

## 2016-07-31 ENCOUNTER — Ambulatory Visit: Payer: Medicaid Other | Admitting: Family Medicine

## 2016-08-06 ENCOUNTER — Encounter: Payer: Self-pay | Admitting: Family Medicine

## 2016-08-06 ENCOUNTER — Ambulatory Visit: Payer: Medicaid Other | Attending: Family Medicine | Admitting: Family Medicine

## 2016-08-06 VITALS — BP 121/81 | HR 75 | Temp 97.9°F | Resp 18 | Ht 62.0 in | Wt 224.0 lb

## 2016-08-06 DIAGNOSIS — Z79899 Other long term (current) drug therapy: Secondary | ICD-10-CM | POA: Insufficient documentation

## 2016-08-06 DIAGNOSIS — Z0000 Encounter for general adult medical examination without abnormal findings: Secondary | ICD-10-CM

## 2016-08-06 DIAGNOSIS — M791 Myalgia: Secondary | ICD-10-CM | POA: Diagnosis not present

## 2016-08-06 DIAGNOSIS — M7918 Myalgia, other site: Secondary | ICD-10-CM

## 2016-08-06 DIAGNOSIS — K219 Gastro-esophageal reflux disease without esophagitis: Secondary | ICD-10-CM | POA: Diagnosis not present

## 2016-08-06 DIAGNOSIS — L918 Other hypertrophic disorders of the skin: Secondary | ICD-10-CM | POA: Diagnosis not present

## 2016-08-06 DIAGNOSIS — Z23 Encounter for immunization: Secondary | ICD-10-CM | POA: Diagnosis not present

## 2016-08-06 MED ORDER — IBUPROFEN 600 MG PO TABS: 600.0000 mg | ORAL_TABLET | Freq: Three times a day (TID) | ORAL | 1 refills | Status: DC | PRN

## 2016-08-06 MED ORDER — OMEPRAZOLE 20 MG PO CPDR: 20.0000 mg | DELAYED_RELEASE_CAPSULE | Freq: Every day | ORAL | 3 refills | Status: DC

## 2016-08-06 NOTE — Progress Notes (Signed)
Patient is here for f/up   Patient denies pain for today  

## 2016-08-06 NOTE — Patient Instructions (Signed)
Food Choices for Gastroesophageal Reflux Disease, Adult When you have gastroesophageal reflux disease (GERD), the foods you eat and your eating habits are very important. Choosing the right foods can help ease your discomfort. What guidelines do I need to follow?  Choose fruits, vegetables, whole grains, and low-fat dairy products.  Choose low-fat meat, fish, and poultry.  Limit fats such as oils, salad dressings, butter, nuts, and avocado.  Keep a food diary. This helps you identify foods that cause symptoms.  Avoid foods that cause symptoms. These may be different for everyone.  Eat small meals often instead of 3 large meals a day.  Eat your meals slowly, in a place where you are relaxed.  Limit fried foods.  Cook foods using methods other than frying.  Avoid drinking alcohol.  Avoid drinking large amounts of liquids with your meals.  Avoid bending over or lying down until 2-3 hours after eating. What foods are not recommended? These are some foods and drinks that may make your symptoms worse: Vegetables  Tomatoes. Tomato juice. Tomato and spaghetti sauce. Chili peppers. Onion and garlic. Horseradish. Fruits  Oranges, grapefruit, and lemon (fruit and juice). Meats  High-fat meats, fish, and poultry. This includes hot dogs, ribs, ham, sausage, salami, and bacon. Dairy  Whole milk and chocolate milk. Sour cream. Cream. Butter. Ice cream. Cream cheese. Drinks  Coffee and tea. Bubbly (carbonated) drinks or energy drinks. Condiments  Hot sauce. Barbecue sauce. Sweets/Desserts  Chocolate and cocoa. Donuts. Peppermint and spearmint. Fats and Oils  High-fat foods. This includes [place] fries and potato chips. Other  Vinegar. Strong spices. This includes black pepper, white pepper, red pepper, cayenne, curry powder, cloves, ginger, and chili powder. The items listed above may not be a complete list of foods and drinks to avoid. Contact your dietitian for more information.    This information is not intended to replace advice given to you by your health care provider. Make sure you discuss any questions you have with your health care provider. Document Released: 09/10/2011 Document Revised: 08/17/2015 Document Reviewed: 01/13/2013 Elsevier Interactive Patient Education  2017 Elsevier Inc.  Arthritis Arthritis means joint pain. It can also mean joint disease. A joint is a place where bones come together. People who have arthritis may have:  Red joints.  Swollen joints.  Stiff joints.  Warm joints.  A fever.  A feeling of being sick. Follow these instructions at home: Pay attention to any changes in your symptoms. Take these actions to help with your pain and swelling. Medicines   Take over-the-counter and prescription medicines only as told by your doctor.  Do not take aspirin for pain if your doctor says that you may have gout. Activity   Rest your joint if your doctor tells you to.  Avoid activities that make the pain worse.  Exercise your joint regularly as told by your doctor. Try doing exercises like:  Swimming.  Water aerobics.  Biking.  Walking. Joint Care    If your joint is swollen, keep it raised (elevated) if told by your doctor.  If your joint feels stiff in the morning, try taking a warm shower.  If you have diabetes, do not apply heat without asking your doctor.  If told, apply heat to the joint:  Put a towel between the joint and the hot pack or heating pad.  Leave the heat on the area for 20-30 minutes.  If told, apply ice to the joint:  Put ice in a plastic bag.  Place  a towel between your skin and the bag.  Leave the ice on for 20 minutes, 2-3 times per day.  Keep all follow-up visits as told by your doctor. Contact a doctor if:  The pain gets worse.  You have a fever. Get help right away if:  You have very bad pain in your joint.  You have swelling in your joint.  Your joint is red.  Many  joints become painful and swollen.  You have very bad back pain.  Your leg is very weak.  You cannot control your pee (urine) or poop (stool). This information is not intended to replace advice given to you by your health care provider. Make sure you discuss any questions you have with your health care provider. Document Released: 06/05/2009 Document Revised: 08/17/2015 Document Reviewed: 06/06/2014 Elsevier Interactive Patient Education  2017 [employer]

## 2016-08-06 NOTE — Progress Notes (Signed)
Subjective:  Patient ID: Lauraine RinneDreamy Borntreger, female    DOB: 06-17-1970  Age: 46 y.o. MRN: [id number]  CC: Establish Care   HPI Tessah Reuel BoomDaniel presents for   History of GERD: This has been associated with heartburn, nausea and abdominal pain.  She denies dysphagia, hematemesis and melena. Symptoms have been present for several months. She denies dysphagia.  She has not lost weight. She denies melena, hematochezia, hematemesis, and coffee ground emesis. Medical therapy in the past has included proton pump inhibitors, which she reports has been effective.  Joint/Muscle Pain: Patient complains of arthralgias and myalgias for which has been present for 1 year. Pain is located in multiple joints, is described as aching, and is intermittent .  Associated symptoms include: none.  The patient has tried nothing for pain relief.  Related to injury: no   Skin Lesion: Patient complains of a skin lesions of the neck. She reports being unaware of when lesions first appeared. Lesion has not changed. Symptoms associated with the lesions are: itching and increasing in number. Patient denies increasing diameter, increasing thickness, bleeding, pain, drainage.    Outpatient Medications Prior to Visit  Medication Sig Dispense Refill  . cyclobenzaprine (FLEXERIL) 10 MG tablet Take 1 tablet (10 mg total) by mouth 2 (two) times daily as needed for muscle spasms. (Patient not taking: Reported on 05/16/2016) 20 tablet 0  . cyclobenzaprine (FLEXERIL) 10 MG tablet Take 1 tablet (10 mg total) by mouth 3 (three) times daily as needed for muscle spasms. (Patient not taking: Reported on 08/06/2016) 30 tablet 0  . meloxicam (MOBIC) 7.5 MG tablet Take 1 tablet (7.5 mg total) by mouth daily. (Patient not taking: Reported on 08/06/2016) 30 tablet 0  . ibuprofen (ADVIL,MOTRIN) 200 MG tablet Take 600 mg by mouth every 6 (six) hours as needed for headache (or pain).    Marland Kitchen. omeprazole (PRILOSEC) 20 MG capsule Take 1 capsule (20 mg total)  by mouth daily. (Patient not taking: Reported on 08/06/2016) 30 capsule 2   No facility-administered medications prior to visit.     ROS Review of Systems  Constitutional: Negative.   HENT: Negative.   Eyes: Negative.   Respiratory: Negative.   Cardiovascular: Negative.   Gastrointestinal: Negative.   Musculoskeletal: Positive for arthralgias and myalgias.  Skin:       Skin lesions  Psychiatric/Behavioral: Negative.     Objective:  BP 121/81 (BP Location: Left Arm, Patient Position: Sitting, Cuff Size: Normal)   Pulse 75   Temp 97.9 F (36.6 C) (Oral)   Resp 18   Ht 5\' 2"  (1.575 m)   Wt 224 lb (101.6 kg)   SpO2 96%   BMI 40.97 kg/m   BP/Weight 08/06/2016 05/30/2016 05/16/2016  Systolic BP 121 127 118  Diastolic BP 81 74 63  Wt. (Lbs) 224 224.2 225.8  BMI 40.97 41.01 41.3    Physical Exam  Constitutional: She appears well-developed and well-nourished.  HENT:  Head: Normocephalic and atraumatic.  Right Ear: External ear normal.  Left Ear: External ear normal.  Nose: Nose normal.  Mouth/Throat: Oropharynx is clear and moist.  Eyes: Conjunctivae are normal. Pupils are equal, round, and reactive to light.  Neck: No JVD present.  Cardiovascular: Normal rate, regular rhythm, normal heart sounds and intact distal pulses.   Pulmonary/Chest: Effort normal and breath sounds normal.  Abdominal: Soft. Bowel sounds are normal.  Musculoskeletal: Normal range of motion.  Lymphadenopathy:    She has no cervical adenopathy.  Skin: Skin is warm and  dry. Rash noted.  Generalized, flesh colored cutaneous skin tags to the bilateral anterior neck and shoulder areas.   Psychiatric: She has a normal mood and affect.  Nursing note and vitals reviewed.   Assessment & Plan:   Problem List Items Addressed This Visit    None    Visit Diagnoses    Musculoskeletal pain    -  Primary   Relevant Medications   ibuprofen (ADVIL,MOTRIN) 600 MG tablet   Other Relevant Orders   Vitamin D,  25-hydroxy (Completed)   Gastroesophageal reflux disease, esophagitis presence not specified       Relevant Medications   omeprazole (PRILOSEC) 20 MG capsule   Cutaneous skin tags       Relevant Orders   Ambulatory referral to Dermatology   Healthcare maintenance       Relevant Orders   Tdap vaccine greater than or equal to 7yo IM (Completed)      Meds ordered this encounter  Medications  . omeprazole (PRILOSEC) 20 MG capsule    Sig: Take 1 capsule (20 mg total) by mouth daily.    Dispense:  90 capsule    Refill:  3    Order Specific Question:   Supervising Provider    Answer:   Quentin Angst [id number]  . ibuprofen (ADVIL,MOTRIN) 600 MG tablet    Sig: Take 1 tablet (600 mg total) by mouth every 8 (eight) hours as needed for moderate pain (Take with food).    Dispense:  30 tablet    Refill:  1    Order Specific Question:   Supervising Provider    Answer:   Quentin Angst [id number]    Follow-up: Return As needed.   Lizbeth Bark FNP

## 2016-08-07 LAB — VITAMIN D 25 HYDROXY (VIT D DEFICIENCY, FRACTURES): Vit D, 25-Hydroxy: 9.8 ng/mL — ABNORMAL LOW (ref 30.0–100.0)

## 2016-08-12 ENCOUNTER — Telehealth: Payer: Self-pay

## 2016-08-12 ENCOUNTER — Other Ambulatory Visit: Payer: Self-pay | Admitting: Family Medicine

## 2016-08-12 DIAGNOSIS — E559 Vitamin D deficiency, unspecified: Secondary | ICD-10-CM

## 2016-08-12 MED ORDER — VITAMIN D (ERGOCALCIFEROL) 1.25 MG (50000 UNIT) PO CAPS: 50000.0000 [IU] | ORAL_CAPSULE | ORAL | 0 refills | Status: AC

## 2016-08-12 NOTE — Telephone Encounter (Signed)
CMA call regarding lab results   Patient Verify DOB   Patient was aware and understood  

## 2016-08-12 NOTE — Telephone Encounter (Signed)
-----   Message from Lizbeth BarkMandesia R Hairston, FNP sent at 08/12/2016 10:45 AM EDT ----- Vitamin D level was low. Vitamin D helps to keep bones strong. When levels are low it can cause muscle and bone pain. You were prescribed ergocalciferol (capsules) to increase your vitamin-d level. Once finished start taking OTC vitamin d supplement with 800 international units (IU) of vitamin-d per day. Recommend follow up in 4 months.

## 2016-09-18 ENCOUNTER — Ambulatory Visit: Payer: Medicaid Other

## 2016-09-18 NOTE — Progress Notes (Deleted)
   Subjective:   Patient ID: Lauraine Rinnereamy Pinegar    DOB: 14-Apr-1970, 46 y.o. female   MRN: [id number]  CC: "Skin tag"  HPI: Annie ParasDreamy Reuel BoomDaniel is a 46 y.o. female who presents to dermatology clinic today for skin tag on neck. Problems discussed today are as follows:  Skin tag: *** ROS: ***  Complete ROS performed, see HPI for pertinent.  PMFSH: Obesity, vitamin D deficiency. H/o cholecystectomy, tubal ligation. Mother h/o DM, heart disease, father h/o DM, heart disease. Smoking status reviewed. Medications reviewed.  Objective:   There were no vitals taken for this visit. Vitals and nursing note reviewed.  General: well nourished, well developed, in no acute distress with non-toxic appearance HEENT: normocephalic, atraumatic, moist mucous membranes Neck: supple, non-tender without lymphadenopathy CV: regular rate and rhythm without murmurs, rubs, or gallops, no lower extremity edema Lungs: clear to auscultation bilaterally with normal work of breathing Abdomen: soft, non-tender, non-distended, no masses or organomegaly palpable, normoactive bowel sounds Skin: warm, dry, no rashes or lesions, cap refill < 2 seconds Extremities: warm and well perfused, normal tone  Assessment & Plan:   No problem-specific Assessment & Plan notes found for this encounter.  No orders of the defined types were placed in this encounter.  No orders of the defined types were placed in this encounter.   This note has been created with Education officer, environmentalDragon speech recognition software and smart phrase technology. Any transcriptional errors are unintentional.  Durward Parcelavid McMullen, DO [facility], PGY-1 09/18/2016 1:41 PM

## 2016-12-25 ENCOUNTER — Emergency Department (HOSPITAL_COMMUNITY): Payer: Medicaid Other

## 2016-12-25 ENCOUNTER — Encounter (HOSPITAL_COMMUNITY): Payer: Self-pay | Admitting: *Deleted

## 2016-12-25 DIAGNOSIS — R0789 Other chest pain: Secondary | ICD-10-CM | POA: Insufficient documentation

## 2016-12-25 DIAGNOSIS — Z79899 Other long term (current) drug therapy: Secondary | ICD-10-CM | POA: Insufficient documentation

## 2016-12-25 LAB — TROPONIN I: Troponin I: <0.03 ng/mL (ref <0.03)

## 2016-12-25 LAB — BASIC METABOLIC PANEL
Anion gap: 10 (ref 5–15)
BUN: 10 mg/dL (ref 6–20)
CHLORIDE: 105 mmol/L (ref 101–111)
CO2: 21 mmol/L — ABNORMAL LOW (ref 22–32)
CREATININE: 0.6 mg/dL (ref 0.44–1.00)
Calcium: 8.2 mg/dL — ABNORMAL LOW (ref 8.9–10.3)
Glucose, Bld: 107 mg/dL — ABNORMAL HIGH (ref 65–99)
POTASSIUM: 3.5 mmol/L (ref 3.5–5.1)
SODIUM: 136 mmol/L (ref 135–145)

## 2016-12-25 LAB — CBC
HCT: 37.1 % (ref 36.0–46.0)
Hemoglobin: 12.2 g/dL (ref 12.0–15.0)
MCH: 31.7 pg (ref 26.0–34.0)
MCHC: 32.9 g/dL (ref 30.0–36.0)
MCV: 96.4 fL (ref 78.0–100.0)
PLATELETS: 351 10*3/uL (ref 150–400)
RBC: 3.85 MIL/uL — AB (ref 3.87–5.11)
RDW: 12.2 % (ref 11.5–15.5)
WBC: 5.5 10*3/uL (ref 4.0–10.5)

## 2016-12-25 NOTE — ED Triage Notes (Signed)
The pt is c/o chest and abd pain for olne week.  She reports that she has been here many times foir the same  She does not have her gb any longer  lmp today

## 2016-12-26 ENCOUNTER — Emergency Department (HOSPITAL_COMMUNITY)
Admission: EM | Admit: 2016-12-26 | Discharge: 2016-12-26 | Disposition: A | Discharge status: Home / Self Care | Payer: Medicaid Other | Attending: Emergency Medicine | Admitting: Emergency Medicine

## 2016-12-26 DIAGNOSIS — M5442 Lumbago with sciatica, left side: Secondary | ICD-10-CM

## 2016-12-26 DIAGNOSIS — M62830 Muscle spasm of back: Secondary | ICD-10-CM

## 2016-12-26 DIAGNOSIS — R0789 Other chest pain: Secondary | ICD-10-CM

## 2016-12-26 DIAGNOSIS — M7918 Myalgia, other site: Secondary | ICD-10-CM

## 2016-12-26 DIAGNOSIS — M5441 Lumbago with sciatica, right side: Secondary | ICD-10-CM

## 2016-12-26 DIAGNOSIS — G8929 Other chronic pain: Secondary | ICD-10-CM

## 2016-12-26 LAB — TROPONIN I

## 2016-12-26 MED ORDER — CYCLOBENZAPRINE HCL 10 MG PO TABS: 10.0000 mg | ORAL_TABLET | Freq: Three times a day (TID) | ORAL | 0 refills | Status: DC | PRN

## 2016-12-26 MED ORDER — IBUPROFEN 800 MG PO TABS
800.0000 mg | ORAL_TABLET | Freq: Once | ORAL | Status: AC
  Administered 2016-12-26: 800 mg via ORAL
  Filled 2016-12-26: qty 1

## 2016-12-26 MED ORDER — IBUPROFEN 600 MG PO TABS: 600.0000 mg | ORAL_TABLET | Freq: Three times a day (TID) | ORAL | 1 refills | Status: DC | PRN

## 2016-12-26 NOTE — ED Provider Notes (Signed)
[facility]-EMERGENCY DEPT Provider Note   CSN: [id number] Arrival date & time: 12/25/16  2051     History   Chief Complaint Chief Complaint  Patient presents with  . Chest Pain    HPI [name] is a 46 y.o. female.  Patient without significant medical history here with chest pain x 1 week in left chest that goes to posterior shoulder and sometimes the left arm. She states it can occur at any time. It is some worse with movement. No SOB, nausea/vomiting, fever or cough. She has taken ibuprofen without significant relief. She has had similar pain in the past.    The history is provided by the patient. No language interpreter was used.  Chest Pain   Pertinent negatives include no abdominal pain, no cough, no fever, no shortness of breath, no vomiting and no weakness.    Past Medical History:  Diagnosis Date  . Gallstones   . No pertinent past medical history   . Obesity (BMI 30-39.9)     Patient Active Problem List   Diagnosis Date Noted  . Vitamin D deficiency 08/12/2016  . LUQ abdominal pain 12/14/2011  . Cholecystitis, acute with cholelithiasis 02/20/2011  . Obesity (BMI 30-39.9) 02/20/2011  . UTI (lower urinary tract infection) 02/20/2011    Past Surgical History:  Procedure Laterality Date  . CHOLECYSTECTOMY  02/20/2011   Procedure: LAPAROSCOPIC CHOLECYSTECTOMY WITH INTRAOPERATIVE CHOLANGIOGRAM;  Surgeon: Kandis Cocking, MD;  Location: [facility] OR;  Service: General;  Laterality: N/A;  . TUBAL LIGATION      OB History    No data available       Home Medications    Prior to Admission medications   Medication Sig Start Date End Date Taking? Authorizing Provider  cyclobenzaprine (FLEXERIL) 10 MG tablet Take 1 tablet (10 mg total) by mouth 2 (two) times daily as needed for muscle spasms. Patient not taking: Reported on 05/16/2016 05/14/16   Roxy Horseman, PA-C  cyclobenzaprine (FLEXERIL) 10 MG tablet Take 1 tablet (10 mg total) by mouth 3 (three) times daily as  needed for muscle spasms. Patient not taking: Reported on 08/06/2016 05/30/16   Lizbeth Bark, FNP  ibuprofen (ADVIL,MOTRIN) 600 MG tablet Take 1 tablet (600 mg total) by mouth every 8 (eight) hours as needed for moderate pain (Take with food). Patient not taking: Reported on 12/26/2016 08/06/16   Lizbeth Bark, FNP  meloxicam (MOBIC) 7.5 MG tablet Take 1 tablet (7.5 mg total) by mouth daily. Patient not taking: Reported on 08/06/2016 05/30/16   Lizbeth Bark, FNP  omeprazole (PRILOSEC) 20 MG capsule Take 1 capsule (20 mg total) by mouth daily. Patient not taking: Reported on 12/26/2016 08/06/16   Lizbeth Bark, FNP    Family History Family History  Problem Relation Age of Onset  . Diabetes Mother   . Heart disease Mother   . Diabetes Father   . Heart disease Father   . Diabetes Paternal Grandmother   . Diabetes Paternal Grandfather   . Diabetes Maternal Grandmother   . Diabetes Maternal Grandfather   . Colon cancer Neg Hx   . Rectal cancer Neg Hx   . Stomach cancer Neg Hx   . Esophageal cancer Neg Hx     Social History Social History  Substance Use Topics  . Smoking status: Former Smoker    Quit date: 03/11/2011  . Smokeless tobacco: Current User  . Alcohol use No     Allergies   Patient has no known allergies.  Review of Systems Review of Systems  Constitutional: Negative for chills and fever.  Respiratory: Negative.  Negative for cough and shortness of breath.   Cardiovascular: Positive for chest pain.  Gastrointestinal: Negative.  Negative for abdominal pain and vomiting.  Musculoskeletal:       See HPI.  Skin: Negative.   Neurological: Negative.  Negative for weakness.     Physical Exam Updated Vital Signs BP 118/79   Pulse 83   Temp 98 F (36.7 C) (Oral)   Resp 20   Ht  (1.575 m)   Wt 101.2 kg (223 lb)   LMP 12/25/2016   SpO2 98%   BMI 40.79 kg/m   Physical Exam  Constitutional: She is oriented to person, place, and  time. She appears well-developed and well-nourished.  HENT:  Head: Normocephalic.  Neck: Normal range of motion. Neck supple.  Cardiovascular: Normal rate and regular rhythm.   No murmur heard. Pulmonary/Chest: Effort normal and breath sounds normal. She has no wheezes. She has no rales. She exhibits tenderness (Left chest wall tenderness. ).  Abdominal: Soft. Bowel sounds are normal. There is no tenderness. There is no rebound and no guarding.  Musculoskeletal: Normal range of motion. She exhibits no edema.  Neurological: She is alert and oriented to person, place, and time.  Skin: Skin is warm and dry. No rash noted.  Psychiatric: She has a normal mood and affect.     ED Treatments / Results  Labs (all labs ordered are listed, but only abnormal results are displayed) Labs Reviewed  BASIC METABOLIC PANEL - Abnormal; Notable for the following:       Result Value   CO2 21 (*)    Glucose, Bld 107 (*)    Calcium 8.2 (*)    All other components within normal limits  CBC - Abnormal; Notable for the following:    RBC 3.85 (*)    All other components within normal limits  TROPONIN I  TROPONIN I    EKG  EKG Interpretation  Date/Time:  Wednesday December 25 2016 21:02:16 EDT Ventricular Rate:  91 PR Interval:  132 QRS Duration: 80 QT Interval:  366 QTC Calculation: 450 R Axis:   59 Text Interpretation:  Normal sinus rhythm Normal ECG When compared with ECG of 05/14/2016, No significant change was found Confirmed by Dione Booze ([id number]) on 12/25/2016 11:24:17 PM       Radiology Dg Chest 2 View  Result Date: 12/25/2016 CLINICAL DATA:  Chest and abdominal pain for 1 week EXAM: CHEST  2 VIEW COMPARISON:  05/14/2016 FINDINGS: The heart size and mediastinal contours are within normal limits. Both lungs are clear. The visualized skeletal structures are unremarkable. IMPRESSION: No active cardiopulmonary disease. Electronically Signed   By: Jasmine Pang M.D.   On: 12/25/2016 21:47     Procedures Procedures (including critical care time)  Medications Ordered in ED Medications - No data to display   Initial Impression / Assessment and Plan / ED Course  I have reviewed the triage vital signs and the nursing notes.  Pertinent labs & imaging results that were available during my care of the patient were reviewed by me and considered in my medical decision making (see chart for details).     Patient presents with left chest pain. This is recurrent pain. On chart review, she has been seen for this in the past, felt to be chest wall pain. She does have significant risk factors for heart disease.   Lab evaluation, including  delta trop, is negative. CXR is clear. EKG shows no ischemic changes. Pain is reproducible on exam. VSS. Exam findings c/w muscular chest wall pain.   She is felt appropriate for discharge home with Rx ibuprofen.   Final Clinical Impressions(s) / ED Diagnoses   Final diagnoses:  None   1. Chest wall pain  New Prescriptions New Prescriptions   No medications on file     Elpidio Anis, Cordelia Poche 12/26/16 1191    Zadie Rhine, MD 12/26/16 4782    Zadie Rhine, MD 12/26/16 [phone]

## 2016-12-26 NOTE — ED Provider Notes (Signed)
Patient seen/examined in the Emergency Department in conjunction with Midlevel Provider [place] Patient reports chest pain and abdominal pain Exam : awake/alert, no distress, watching TV, vitals appropriate, diffuse tenderness left chest/abdomen Plan: labs pending Anticipate discharge home     Zadie Rhine, MD 12/26/16 [phone]

## 2017-08-27 ENCOUNTER — Encounter (HOSPITAL_COMMUNITY): Payer: Self-pay | Admitting: Emergency Medicine

## 2017-08-27 ENCOUNTER — Emergency Department (HOSPITAL_COMMUNITY): Payer: Managed Care, Other (non HMO)

## 2017-08-27 ENCOUNTER — Emergency Department (HOSPITAL_COMMUNITY)
Admission: EM | Admit: 2017-08-27 | Discharge: 2017-08-27 | Disposition: A | Discharge status: Home / Self Care | Payer: Managed Care, Other (non HMO) | Attending: Emergency Medicine | Admitting: Emergency Medicine

## 2017-08-27 DIAGNOSIS — R109 Unspecified abdominal pain: Secondary | ICD-10-CM

## 2017-08-27 DIAGNOSIS — R1013 Epigastric pain: Secondary | ICD-10-CM | POA: Insufficient documentation

## 2017-08-27 DIAGNOSIS — M25552 Pain in left hip: Secondary | ICD-10-CM | POA: Insufficient documentation

## 2017-08-27 DIAGNOSIS — Z87891 Personal history of nicotine dependence: Secondary | ICD-10-CM | POA: Insufficient documentation

## 2017-08-27 DIAGNOSIS — R079 Chest pain, unspecified: Secondary | ICD-10-CM | POA: Diagnosis not present

## 2017-08-27 DIAGNOSIS — Z79899 Other long term (current) drug therapy: Secondary | ICD-10-CM | POA: Diagnosis not present

## 2017-08-27 DIAGNOSIS — W19XXXA Unspecified fall, initial encounter: Secondary | ICD-10-CM

## 2017-08-27 LAB — BASIC METABOLIC PANEL
ANION GAP: 8 (ref 5–15)
BUN: 10 mg/dL (ref 6–20)
CALCIUM: 8.1 mg/dL — AB (ref 8.9–10.3)
CO2: 21 mmol/L — AB (ref 22–32)
Chloride: 106 mmol/L (ref 101–111)
Creatinine, Ser: 0.83 mg/dL (ref 0.44–1.00)
Glucose, Bld: 132 mg/dL — ABNORMAL HIGH (ref 65–99)
POTASSIUM: 3.3 mmol/L — AB (ref 3.5–5.1)
Sodium: 135 mmol/L (ref 135–145)

## 2017-08-27 LAB — HEPATIC FUNCTION PANEL
ALT: 15 U/L (ref 14–54)
AST: 18 U/L (ref 15–41)
Albumin: 2.9 g/dL — ABNORMAL LOW (ref 3.5–5.0)
Alkaline Phosphatase: 71 U/L (ref 38–126)
Total Bilirubin: 0.2 mg/dL — ABNORMAL LOW (ref 0.3–1.2)
Total Protein: 6.7 g/dL (ref 6.5–8.1)

## 2017-08-27 LAB — CBC
HEMATOCRIT: 38.1 % (ref 36.0–46.0)
HEMOGLOBIN: 13.1 g/dL (ref 12.0–15.0)
MCH: 33 pg (ref 26.0–34.0)
MCHC: 34.4 g/dL (ref 30.0–36.0)
MCV: 96 fL (ref 78.0–100.0)
Platelets: 371 10*3/uL (ref 150–400)
RBC: 3.97 MIL/uL (ref 3.87–5.11)
RDW: 11.6 % (ref 11.5–15.5)
WBC: 6.6 10*3/uL (ref 4.0–10.5)

## 2017-08-27 LAB — I-STAT TROPONIN, ED: TROPONIN I, POC: 0 ng/mL (ref 0.00–0.08)

## 2017-08-27 LAB — I-STAT BETA HCG BLOOD, ED (MC, WL, AP ONLY): I-stat hCG, quantitative: <5 m[IU]/mL (ref <5)

## 2017-08-27 LAB — LIPASE, BLOOD: LIPASE: 33 U/L (ref 11–51)

## 2017-08-27 MED ORDER — SODIUM CHLORIDE 0.9 % IV BOLUS
1000.0000 mL | Freq: Once | INTRAVENOUS | Status: AC
  Administered 2017-08-27: 1000 mL via INTRAVENOUS

## 2017-08-27 MED ORDER — MORPHINE SULFATE (PF) 4 MG/ML IV SOLN
4.0000 mg | Freq: Once | INTRAVENOUS | Status: AC
  Administered 2017-08-27: 4 mg via INTRAVENOUS
  Filled 2017-08-27: qty 1

## 2017-08-27 MED ORDER — ONDANSETRON HCL 4 MG/2ML IJ SOLN
4.0000 mg | Freq: Once | INTRAMUSCULAR | Status: AC
  Administered 2017-08-27: 4 mg via INTRAVENOUS
  Filled 2017-08-27: qty 2

## 2017-08-27 MED ORDER — IOHEXOL 300 MG/ML  SOLN
100.0000 mL | Freq: Once | INTRAMUSCULAR | Status: AC | PRN
  Administered 2017-08-27: 100 mL via INTRAVENOUS

## 2017-08-27 NOTE — ED Triage Notes (Signed)
Patient reports intermittent left chest pain radiating to left arm with mild SOB and occasional dry cough onset last week , denies emesis or diaphoresis . Pt. Added increasing upper abdominal swelling this week .

## 2017-08-27 NOTE — ED Provider Notes (Signed)
Patient signed out to follow-up CT scan of the abdomen.  CT scan results reviewed no acute findings.  Patient stable to follow-up outpatient.  Kenton KingfisherJoshua M Aryahi Denzler    Netha Dafoe, MD 08/27/17 [phone]

## 2017-08-27 NOTE — ED Notes (Signed)
Pt to CT with tech.

## 2017-08-27 NOTE — Discharge Instructions (Addendum)
If you were given medicines take as directed.  If you are on coumadin or contraceptives realize their levels and effectiveness is altered by many different medicines.  If you have any reaction (rash, tongues swelling, other) to the medicines stop taking and see a physician.    If your blood pressure was elevated in the ER make sure you follow up for management with a primary doctor or return for chest pain, shortness of breath or stroke symptoms.  Please follow up as directed and return to the ER or see a physician for new or worsening symptoms.  Thank you. Vitals:   08/27/17 0615 08/27/17 0630 08/27/17 0645 08/27/17 0700  BP: 102/67 101/60 129/72 119/74  Pulse: 80 84 (!) 103 82  Resp: (!) 21 (!) 27 20 20   Temp:      TempSrc:      SpO2: 97% 99% 97% 98%

## 2017-08-27 NOTE — ED Provider Notes (Signed)
[facility] EMERGENCY DEPARTMENT Provider Note   CSN: [id number] Arrival date & time: 08/27/17  0135     History   Chief Complaint Chief Complaint  Patient presents with  . Chest Pain  . Abdominal Swelling    HPI [name] is a 47 y.o. female.  Patient is a 47 year old female with past medical history of cholecystectomy and obesity resenting for evaluation of abdominal pain.  This started 1 week ago and is gradually worsened.  The pain is across her upper abdomen and the right upper quadrant, left upper quadrant, and epigastric region.  She denies any relation of her symptoms to food.  She denies any fevers or chills.  She denies any bowel or bladder complaints.  The history is provided by the patient.  Abdominal Pain   This is a new problem. Episode onset: 1 week ago. The problem occurs constantly. The problem has been gradually worsening. The pain is associated with an unknown factor. The pain is located in the LUQ, RUQ and epigastric region. The quality of the pain is cramping. The pain is moderate. Pertinent negatives include fever, hematochezia, constipation and dysuria. The symptoms are aggravated by certain positions and palpation. Nothing relieves the symptoms.    Past Medical History:  Diagnosis Date  . Gallstones   . No pertinent past medical history   . Obesity (BMI 30-39.9)     Patient Active Problem List   Diagnosis Date Noted  . Vitamin D deficiency 08/12/2016  . LUQ abdominal pain 12/14/2011  . Cholecystitis, acute with cholelithiasis 02/20/2011  . Obesity (BMI 30-39.9) 02/20/2011  . UTI (lower urinary tract infection) 02/20/2011    Past Surgical History:  Procedure Laterality Date  . CHOLECYSTECTOMY  02/20/2011   Procedure: LAPAROSCOPIC CHOLECYSTECTOMY WITH INTRAOPERATIVE CHOLANGIOGRAM;  Surgeon: Kandis Cocking, MD;  Location: [facility] OR;  Service: General;  Laterality: N/A;  . TUBAL LIGATION       OB History   None      Home  Medications    Prior to Admission medications   Medication Sig Start Date End Date Taking? Authorizing Provider  cyclobenzaprine (FLEXERIL) 10 MG tablet Take 1 tablet (10 mg total) by mouth 3 (three) times daily as needed for muscle spasms. 12/26/16   Elpidio Anis, PA-C  ibuprofen (ADVIL,MOTRIN) 600 MG tablet Take 1 tablet (600 mg total) by mouth every 8 (eight) hours as needed for moderate pain (Take with food). 12/26/16   Elpidio Anis, PA-C  meloxicam (MOBIC) 7.5 MG tablet Take 1 tablet (7.5 mg total) by mouth daily. Patient not taking: Reported on 08/06/2016 05/30/16   Lizbeth Bark, FNP  omeprazole (PRILOSEC) 20 MG capsule Take 1 capsule (20 mg total) by mouth daily. Patient not taking: Reported on 12/26/2016 08/06/16   Lizbeth Bark, FNP    Family History Family History  Problem Relation Age of Onset  . Diabetes Mother   . Heart disease Mother   . Diabetes Father   . Heart disease Father   . Diabetes Paternal Grandmother   . Diabetes Paternal Grandfather   . Diabetes Maternal Grandmother   . Diabetes Maternal Grandfather   . Colon cancer Neg Hx   . Rectal cancer Neg Hx   . Stomach cancer Neg Hx   . Esophageal cancer Neg Hx     Social History Social History   Tobacco Use  . Smoking status: Former Smoker    Last attempt to quit: 03/11/2011    Years since quitting: 6.4  .  Smokeless tobacco: Current User  Substance Use Topics  . Alcohol use: No  . Drug use: No     Allergies   Patient has no known allergies.   Review of Systems Review of Systems  Constitutional: Negative for fever.  Gastrointestinal: Positive for abdominal pain. Negative for constipation and hematochezia.  Genitourinary: Negative for dysuria.  All other systems reviewed and are negative.    Physical Exam Updated Vital Signs BP 105/64   Pulse 80   Temp 98.6 F (37 C) (Oral)   Resp 18   LMP 08/22/2017   SpO2 100%   Physical Exam  Constitutional: She is oriented to person,  place, and time. She appears well-developed and well-nourished. No distress.  HENT:  Head: Normocephalic and atraumatic.  Neck: Normal range of motion. Neck supple.  Cardiovascular: Normal rate and regular rhythm. Exam reveals no gallop and no friction rub.  No murmur heard. Pulmonary/Chest: Effort normal and breath sounds normal. No respiratory distress. She has no wheezes.  Abdominal: Soft. Bowel sounds are normal. She exhibits no distension. There is tenderness. There is no rebound and no guarding.  There is tenderness across the upper abdomen in the right upper quadrant, left upper quadrant, and epigastric regions.  Musculoskeletal: Normal range of motion.  Neurological: She is alert and oriented to person, place, and time.  Skin: Skin is warm and dry. She is not diaphoretic.  Nursing note and vitals reviewed.    ED Treatments / Results  Labs (all labs ordered are listed, but only abnormal results are displayed) Labs Reviewed  BASIC METABOLIC PANEL - Abnormal; Notable for the following components:      Result Value   Potassium 3.3 (*)    CO2 21 (*)    Glucose, Bld 132 (*)    Calcium 8.1 (*)    All other components within normal limits  CBC  LIPASE, BLOOD  HEPATIC FUNCTION PANEL  I-STAT TROPONIN, ED  I-STAT BETA HCG BLOOD, ED ([facility], [facility], [facility] ONLY)    EKG None  Radiology Dg Chest 2 View  Result Date: 08/27/2017 CLINICAL DATA:  Mid chest pain tonight. EXAM: CHEST - 2 VIEW COMPARISON:  Radiographs 12/25/2016 FINDINGS: The cardiomediastinal contours are normal. The lungs are clear. Pulmonary vasculature is normal. No consolidation, pleural effusion, or pneumothorax. No acute osseous abnormalities are seen. IMPRESSION: Unremarkable radiographs of the chest. Electronically Signed   By: Rubye OaksMelanie  Ehinger M.D.   On: 08/27/2017 02:30    Procedures Procedures (including critical care time)  Medications Ordered in ED Medications  sodium chloride 0.9 % bolus 1,000 mL (has no  administration in time range)  morphine 4 MG/ML injection 4 mg (has no administration in time range)  ondansetron (ZOFRAN) injection 4 mg (has no administration in time range)     Initial Impression / Assessment and Plan / ED Course  I have reviewed the triage vital signs and the nursing notes.  Pertinent labs & imaging results that were available during my care of the patient were reviewed by me and considered in my medical decision making (see chart for details).  Patient with worsening epigastric abdominal pain over the past week.  Her laboratory studies are reassuring, however her pain is not much improved with medications given in the ER.  She will have a CT scan performed to further explain the cause of her pain.  Care will be signed out at shift change to Dr. Jodi MourningZavitz who will obtain the results of the CT scan and determine the final  disposition.  Final Clinical Impressions(s) / ED Diagnoses   Final diagnoses:  None    ED Discharge Orders    None       Geoffery Lyons, MD 08/27/17 2309

## 2020-05-01 ENCOUNTER — Ambulatory Visit (INDEPENDENT_AMBULATORY_CARE_PROVIDER_SITE_OTHER): Payer: Managed Care, Other (non HMO)

## 2020-05-01 ENCOUNTER — Ambulatory Visit: Payer: Managed Care, Other (non HMO) | Admitting: Podiatry

## 2020-05-01 ENCOUNTER — Other Ambulatory Visit: Payer: Self-pay

## 2020-05-01 DIAGNOSIS — S9002XA Contusion of left ankle, initial encounter: Secondary | ICD-10-CM

## 2020-05-01 DIAGNOSIS — S9000XA Contusion of unspecified ankle, initial encounter: Secondary | ICD-10-CM

## 2020-05-01 DIAGNOSIS — S8262XA Displaced fracture of lateral malleolus of left fibula, initial encounter for closed fracture: Secondary | ICD-10-CM | POA: Diagnosis not present

## 2020-05-01 NOTE — Progress Notes (Signed)
   HPI: 50 y.o. female presenting today as a new patient for evaluation of left ankle pain that began when she sustained a fall injury at home the last week of December 2021.  Patient has had pain and tenderness ever since the fall injury.  She has not done anything for treatment.  She presents for further treatment evaluation  Past Medical History:  Diagnosis Date  . Gallstones   . No pertinent past medical history   . Obesity (BMI 30-39.9)      Physical Exam: General: The patient is alert and oriented x3 in no acute distress.  Dermatology: Skin is warm, dry and supple bilateral lower extremities. Negative for open lesions or macerations.  Vascular: Palpable pedal pulses bilaterally.  Moderate edema noted to the left ankle.  No erythema noted. Capillary refill within normal limits.  Neurological: Epicritic and protective threshold grossly intact bilaterally.   Musculoskeletal Exam: Tenderness to palpation left ankle joint lateral aspect directly overlying the lateral malleolus of the fibula.  Radiographic Exam:  Oblique Danis Weber type II fracture with lateral displacement noted left ankle.  Appears somewhat chronic in nature with some evidence of callus formation  Assessment: 1.  Laterally displaced, closed, fibular malleolar fracture left.  DOI: last week of December 2021   Plan of Care:  1. Patient evaluated. X-Rays reviewed.  2. Today we discussed the conservative versus surgical management of the presenting pathology. The patient opts for surgical management. All possible complications and details of the procedure were explained. All patient questions were answered. No guarantees were expressed or implied. 3. Authorization for surgery was initiated today. Surgery will consist of open reduction internal fixation left ankle fracture 4.  Order placed for knee scooter 5.  Cam boot dispensed.  Patient may weight-bear as tolerated with the cam boot up until surgery.  Postoperatively  she will be nonweightbearing in the cam boot 6.  Return to clinic 1 week postop  *From [place].  Husband's name is Oltrick.      Felecia Shelling, DPM [facility]  Dr. Felecia Shelling, DPM    [address] [place], [place] [address]                Office [phone]  Fax [phone]

## 2020-06-07 ENCOUNTER — Telehealth: Payer: Self-pay | Admitting: Urology

## 2020-06-07 NOTE — Telephone Encounter (Signed)
DOS: 06/22/20  ORIF ANKLE FX RIGHT -- [id number]  PER [employer] CPT CODE 70786, NO PRIOR AUTH IS NEEDED REF# [id number]

## 2020-06-22 ENCOUNTER — Other Ambulatory Visit: Payer: Self-pay | Admitting: Podiatry

## 2020-06-22 ENCOUNTER — Encounter: Payer: Self-pay | Admitting: Podiatry

## 2020-06-22 DIAGNOSIS — S8262XA Displaced fracture of lateral malleolus of left fibula, initial encounter for closed fracture: Secondary | ICD-10-CM | POA: Diagnosis not present

## 2020-06-22 MED ORDER — OXYCODONE-ACETAMINOPHEN 5-325 MG PO TABS: 1.0000 | ORAL_TABLET | ORAL | 0 refills | Status: DC | PRN

## 2020-06-22 MED ORDER — MELOXICAM 15 MG PO TABS: 15.0000 mg | ORAL_TABLET | Freq: Every day | ORAL | 1 refills | Status: DC

## 2020-06-22 NOTE — Progress Notes (Signed)
PRN postop 

## 2020-06-28 ENCOUNTER — Ambulatory Visit (INDEPENDENT_AMBULATORY_CARE_PROVIDER_SITE_OTHER): Payer: Managed Care, Other (non HMO) | Admitting: Podiatry

## 2020-06-28 ENCOUNTER — Other Ambulatory Visit: Payer: Self-pay

## 2020-06-28 ENCOUNTER — Ambulatory Visit (INDEPENDENT_AMBULATORY_CARE_PROVIDER_SITE_OTHER): Payer: Managed Care, Other (non HMO)

## 2020-06-28 DIAGNOSIS — Z9889 Other specified postprocedural states: Secondary | ICD-10-CM

## 2020-06-28 DIAGNOSIS — S8262XA Displaced fracture of lateral malleolus of left fibula, initial encounter for closed fracture: Secondary | ICD-10-CM

## 2020-06-28 NOTE — Progress Notes (Signed)
   Subjective:  Patient presents today status post ORIF left fibula fracture. DOS: 06/22/2020.  Patient states that she is doing well.  Medication helps slightly with the pain.  She has been nonweightbearing in the cam boot as instructed.  No new complaints at this time  Past Medical History:  Diagnosis Date  . Gallstones   . No pertinent past medical history   . Obesity (BMI 30-39.9)       Objective/Physical Exam Neurovascular status intact.  Skin incisions appear to be well coapted with staples intact. No sign of infectious process noted. No dehiscence. No active bleeding noted. Moderate edema noted to the surgical extremity.  Radiographic Exam:  Orthopedic hardware and osteotomies sites appear to be stable with routine healing.  Assessment: 1. s/p ORIF left fibula fracture. DOS: 06/22/2020   Plan of Care:  1. Patient was evaluated. X-rays reviewed 2.  Dressings changed today. 3.  Recommend Ace wrap daily 4.  Continue nonweightbearing in the cam boot 5.  Return to clinic in 2 weeks for staple removal   Felecia Shelling, DPM [facility]  Dr. Felecia Shelling, DPM    [address] [place], [place] [address]                Office [phone]  Fax [phone]

## 2020-07-05 ENCOUNTER — Encounter: Payer: Managed Care, Other (non HMO) | Admitting: Podiatry

## 2020-07-12 ENCOUNTER — Ambulatory Visit (INDEPENDENT_AMBULATORY_CARE_PROVIDER_SITE_OTHER): Payer: Managed Care, Other (non HMO) | Admitting: Podiatry

## 2020-07-12 ENCOUNTER — Other Ambulatory Visit: Payer: Self-pay

## 2020-07-12 DIAGNOSIS — S8262XA Displaced fracture of lateral malleolus of left fibula, initial encounter for closed fracture: Secondary | ICD-10-CM

## 2020-07-12 DIAGNOSIS — Z9889 Other specified postprocedural states: Secondary | ICD-10-CM

## 2020-07-12 NOTE — Progress Notes (Signed)
   Subjective:  Patient presents today status post ORIF left fibula fracture. DOS: 06/22/2020.  Patient states that she is doing well.  Medication helps slightly with the pain.  She actually presents today wearing crocs sandals bilateral.  She states that she has been weightbearing in crocs sandals around the house.  She no longer is wears the cam boot  Past Medical History:  Diagnosis Date  . Gallstones   . No pertinent past medical history   . Obesity (BMI 30-39.9)       Objective/Physical Exam Neurovascular status intact.  Skin incisions appear to be well coapted with staples intact. No sign of infectious process noted. No dehiscence. No active bleeding noted. Moderate edema noted to the surgical extremity.  Assessment: 1. s/p ORIF left fibula fracture. DOS: 06/22/2020   Plan of Care:  1. Patient was evaluated.  Staples removed today 2.  Dressings changed today. 3.  Continue Ace wrap daily.  Ace wrap provided  4.  Patient may begin partial weightbearing in the cam boot.  Stressed the importance of wearing the cam boot for immobilization 5.  Return to clinic in 2 weeks for follow-up x-ray   Felecia Shelling, DPM [facility]  Dr. Felecia Shelling, DPM    [address] [place], [place] [address]                Office [phone]  Fax [phone]

## 2020-07-19 ENCOUNTER — Encounter: Payer: Managed Care, Other (non HMO) | Admitting: Podiatry

## 2020-07-24 ENCOUNTER — Ambulatory Visit (INDEPENDENT_AMBULATORY_CARE_PROVIDER_SITE_OTHER): Payer: Managed Care, Other (non HMO) | Admitting: Podiatry

## 2020-07-24 ENCOUNTER — Other Ambulatory Visit: Payer: Self-pay

## 2020-07-24 ENCOUNTER — Ambulatory Visit (INDEPENDENT_AMBULATORY_CARE_PROVIDER_SITE_OTHER): Payer: Managed Care, Other (non HMO)

## 2020-07-24 DIAGNOSIS — S8262XA Displaced fracture of lateral malleolus of left fibula, initial encounter for closed fracture: Secondary | ICD-10-CM | POA: Diagnosis not present

## 2020-07-24 DIAGNOSIS — Z9889 Other specified postprocedural states: Secondary | ICD-10-CM

## 2020-07-24 NOTE — Progress Notes (Signed)
   Subjective:  Patient presents today status post ORIF left fibula fracture. DOS: 06/22/2020.  Patient states that she is doing well.  Patient no longer taking any medication.  Today again she presents wearing crocs and sandals.  No new complaints at this time  Past Medical History:  Diagnosis Date  . Gallstones   . No pertinent past medical history   . Obesity (BMI 30-39.9)       Objective/Physical Exam Neurovascular status intact.  Skin incisions appear to be well coapted and healed. No sign of infectious process noted. No dehiscence. No active bleeding noted. Moderate edema noted to the surgical extremity.  Assessment: 1. s/p ORIF left fibula fracture. DOS: 06/22/2020   Plan of Care:  1. Patient was evaluated.  2.  Patient may begin to transition out of the cam boot into good supportive sneakers and shoes 3.  Return to clinic in 6 weeks for final follow-up x-ray   Felecia Shelling, DPM [facility]  Dr. Felecia Shelling, DPM    [address] [place], [place] [address]                Office [phone]  Fax [phone]

## 2020-09-04 ENCOUNTER — Encounter: Payer: Managed Care, Other (non HMO) | Admitting: Podiatry

## 2022-07-23 ENCOUNTER — Emergency Department (HOSPITAL_COMMUNITY): Payer: Managed Care, Other (non HMO)

## 2022-07-23 ENCOUNTER — Other Ambulatory Visit: Payer: Self-pay

## 2022-07-23 ENCOUNTER — Emergency Department (HOSPITAL_COMMUNITY)
Admission: EM | Admit: 2022-07-23 | Discharge: 2022-07-23 | Disposition: A | Discharge status: Home / Self Care | Payer: Managed Care, Other (non HMO) | Attending: Emergency Medicine | Admitting: Emergency Medicine

## 2022-07-23 DIAGNOSIS — R0789 Other chest pain: Secondary | ICD-10-CM | POA: Diagnosis present

## 2022-07-23 DIAGNOSIS — R072 Precordial pain: Secondary | ICD-10-CM | POA: Diagnosis not present

## 2022-07-23 DIAGNOSIS — M5412 Radiculopathy, cervical region: Secondary | ICD-10-CM | POA: Diagnosis not present

## 2022-07-23 LAB — CBC
HCT: 43.5 % (ref 36.0–46.0)
Hemoglobin: 15 g/dL (ref 12.0–15.0)
MCH: 32.8 pg (ref 26.0–34.0)
MCHC: 34.5 g/dL (ref 30.0–36.0)
MCV: 95 fL (ref 80.0–100.0)
Platelets: 314 10*3/uL (ref 150–400)
RBC: 4.58 MIL/uL (ref 3.87–5.11)
RDW: 11.7 % (ref 11.5–15.5)
WBC: 5.5 10*3/uL (ref 4.0–10.5)
nRBC: 0 % (ref 0.0–0.2)

## 2022-07-23 LAB — TROPONIN I (HIGH SENSITIVITY)
Troponin I (High Sensitivity): 4 ng/L (ref <18)
Troponin I (High Sensitivity): 3 ng/L (ref <18)

## 2022-07-23 LAB — BASIC METABOLIC PANEL
Anion gap: 11 (ref 5–15)
BUN: 13 mg/dL (ref 6–20)
CO2: 22 mmol/L (ref 22–32)
Calcium: 8.4 mg/dL — ABNORMAL LOW (ref 8.9–10.3)
Chloride: 101 mmol/L (ref 98–111)
Creatinine, Ser: 0.86 mg/dL (ref 0.44–1.00)
GFR, Estimated: >60 mL/min (ref >60)
Glucose, Bld: 106 mg/dL — ABNORMAL HIGH (ref 70–99)
Potassium: 3.6 mmol/L (ref 3.5–5.1)
Sodium: 134 mmol/L — ABNORMAL LOW (ref 135–145)

## 2022-07-23 LAB — PROTIME-INR
INR: 1.1 (ref 0.8–1.2)
Prothrombin Time: 13.6 seconds (ref 11.4–15.2)

## 2022-07-23 LAB — HCG, QUANTITATIVE, PREGNANCY: hCG, Beta Chain, Quant, S: 6 m[IU]/mL — ABNORMAL HIGH (ref <5)

## 2022-07-23 LAB — I-STAT BETA HCG BLOOD, ED (MC, WL, AP ONLY)
I-stat hCG, quantitative: 5.2 m[IU]/mL — ABNORMAL HIGH (ref <5)
I-stat hCG, quantitative: <5 m[IU]/mL (ref <5)

## 2022-07-23 MED ORDER — METHOCARBAMOL 500 MG PO TABS: 500.0000 mg | ORAL_TABLET | Freq: Two times a day (BID) | ORAL | 0 refills | Status: DC

## 2022-07-23 MED ORDER — DEXAMETHASONE SODIUM PHOSPHATE 10 MG/ML IJ SOLN
10.0000 mg | Freq: Once | INTRAMUSCULAR | Status: AC
  Administered 2022-07-23: 10 mg via INTRAVENOUS
  Filled 2022-07-23: qty 1

## 2022-07-23 MED ORDER — IBUPROFEN 400 MG PO TABS
400.0000 mg | ORAL_TABLET | Freq: Once | ORAL | Status: AC
  Administered 2022-07-23: 400 mg via ORAL
  Filled 2022-07-23: qty 1

## 2022-07-23 MED ORDER — IOHEXOL 350 MG/ML SOLN
100.0000 mL | Freq: Once | INTRAVENOUS | Status: AC | PRN
  Administered 2022-07-23: 100 mL via INTRAVENOUS

## 2022-07-23 MED ORDER — HYDROCODONE-ACETAMINOPHEN 5-325 MG PO TABS
1.0000 | ORAL_TABLET | Freq: Once | ORAL | Status: AC
  Administered 2022-07-23: 1 via ORAL
  Filled 2022-07-23: qty 1

## 2022-07-23 MED ORDER — METHOCARBAMOL 500 MG PO TABS
500.0000 mg | ORAL_TABLET | Freq: Once | ORAL | Status: AC
  Administered 2022-07-23: 500 mg via ORAL
  Filled 2022-07-23: qty 1

## 2022-07-23 MED ORDER — FENTANYL CITRATE PF 50 MCG/ML IJ SOSY
100.0000 ug | PREFILLED_SYRINGE | Freq: Once | INTRAMUSCULAR | Status: AC
  Administered 2022-07-23: 100 ug via INTRAVENOUS
  Filled 2022-07-23: qty 2

## 2022-07-23 NOTE — ED Provider Notes (Signed)
[name] EMERGENCY DEPARTMENT AT [facility] Provider Note   CSN: [id number] Arrival date & time: 07/23/22  0000     History  Chief Complaint  Patient presents with   Chest Pain    [name] is a 52 y.o. female.  The history is provided by the patient and the spouse.  Patient with history of obesity presents with multiple complaints.  Patient reports for the past several months she has had pain in her left shoulder, left neck, left scapular region.  At times it will move into her left arm and her left chest.  She reports this worsened over the past week.  No fevers or vomiting.  No cough.  She has had some shortness of breath.  No recent falls or trauma.  Denies any history of diabetes or hypertension.  No previous cardiac disease    Past Medical History:  Diagnosis Date   Gallstones    No pertinent past medical history    Obesity (BMI 30-39.9)     Home Medications Prior to Admission medications   Medication Sig Start Date End Date Taking? Authorizing Provider  methocarbamol (ROBAXIN) 500 MG tablet Take 1 tablet (500 mg total) by mouth 2 (two) times daily. 07/23/22  Yes Zadie Rhine, MD  meloxicam (MOBIC) 15 MG tablet Take 1 tablet (15 mg total) by mouth daily. 06/22/20   Felecia Shelling, DPM      Allergies    Patient has no known allergies.    Review of Systems   Review of Systems  Constitutional:  Negative for fever.  Cardiovascular:  Positive for chest pain.  Gastrointestinal:  Negative for vomiting.  Musculoskeletal:  Positive for arthralgias and neck pain.  Neurological:  Negative for weakness.    Physical Exam Updated Vital Signs BP (!) 100/55   Pulse 63   Temp 99.3 F (37.4 C) (Oral)   Resp 16   SpO2 98%  Physical Exam CONSTITUTIONAL: Well developed/well nourished, uncomfortable appearing HEAD: Normocephalic/atraumatic EYES: EOMI/PERRL ENMT: Mucous membranes moist, hirsutism noted NECK: supple no meningeal signs, no  bruits SPINE/BACK:entire spine nontender Cervical paraspinal tenderness Chronic hump noted in the cervical region No bruising/crepitance/stepoffs noted to spine CV: S1/S2 noted LUNGS: Lungs are clear to auscultation bilaterally, no apparent distress ABDOMEN: soft, nontender NEURO: Pt is awake/alert/appropriate, moves all extremitiesx4.  No facial droop.  No arm drift  equal power (5/5) with hand grip, wrist flex/extension, elbow flex/extension, and equal power with shoulder abduction/adduction.  No focal sensory deficit to light touch is noted in either UE.   EXTREMITIES: pulses normal/equal, full ROM No deformities, no edema noted to the upper extremities.  Distal pulses equal intact in both upper extremity SKIN: warm, color normal PSYCH: Mildly anxious  ED Results / Procedures / Treatments   Labs (all labs ordered are listed, but only abnormal results are displayed) Labs Reviewed  BASIC METABOLIC PANEL - Abnormal; Notable for the following components:      Result Value   Sodium 134 (*)    Glucose, Bld 106 (*)    Calcium 8.4 (*)    All other components within normal limits  HCG, QUANTITATIVE, PREGNANCY - Abnormal; Notable for the following components:   hCG, Beta Chain, Quant, S 6 (*)    All other components within normal limits  I-STAT BETA HCG BLOOD, ED ([facility], [facility], [facility] ONLY) - Abnormal; Notable for the following components:   I-stat hCG, quantitative 5.2 (*)    All other components within normal limits  CBC  PROTIME-INR  I-STAT BETA HCG BLOOD, ED ([facility], [facility], [facility] ONLY)  TROPONIN I (HIGH SENSITIVITY)  TROPONIN I (HIGH SENSITIVITY)    EKG EKG Interpretation  Date/Time:  Tuesday July 23 2022 01:54:38 EDT Ventricular Rate:  79 PR Interval:  123 QRS Duration: 80 QT Interval:  374 QTC Calculation: 429 R Axis:   54 Text Interpretation: Sinus rhythm Confirmed by Zadie Rhine ([id number]) on 07/23/2022 1:56:57 AM  Radiology CT Angio Chest/Abd/Pel for Dissection W and/or Wo  Contrast  Result Date: 07/23/2022 CLINICAL DATA:  Left-sided chest pain for several weeks with shortness of breath, initial encounter EXAM: CT ANGIOGRAPHY CHEST, ABDOMEN AND PELVIS TECHNIQUE: Non-contrast CT of the chest was initially obtained. Multidetector CT imaging through the chest, abdomen and pelvis was performed using the standard protocol during bolus administration of intravenous contrast. Multiplanar reconstructed images and MIPs were obtained and reviewed to evaluate the vascular anatomy. RADIATION DOSE REDUCTION: This exam was performed according to the departmental dose-optimization program which includes automated exposure control, adjustment of the mA and/or kV according to patient size and/or use of iterative reconstruction technique. CONTRAST:  OMNIPAQUE IOHEXOL 350 MG/ML SOLN COMPARISON:  Chest x-ray from earlier in the same day. FINDINGS: CTA CHEST FINDINGS Cardiovascular: Initial precontrast images demonstrate mild atherosclerotic calcification of the thoracic aorta. No aneurysmal dilatation is seen. No hyperdense crescent to suggest acute aortic injury is noted. Post-contrast images show no evidence of dissection. No coronary calcifications are seen. No cardiac enlargement is noted. Pulmonary artery shows no large central pulmonary embolus although timing was not performed for embolus evaluation. Mediastinum/Nodes: Thoracic inlet is within normal limits. No hilar or mediastinal adenopathy is noted. The esophagus is within normal limits. Lungs/Pleura: Lungs are well aerated bilaterally. No focal infiltrate or sizable effusion is seen. Musculoskeletal: No acute rib abnormality is noted. No compression deformity is seen. Review of the MIP images confirms the above findings. CTA ABDOMEN AND PELVIS FINDINGS VASCULAR Aorta: Abdominal aorta shows no aneurysmal dilatation or dissection. Celiac: Patent without evidence of aneurysm, dissection, vasculitis or significant stenosis. SMA: Patent  without evidence of aneurysm, dissection, vasculitis or significant stenosis. Renals: Both renal arteries are patent without evidence of aneurysm, dissection, vasculitis, fibromuscular dysplasia or significant stenosis. Dual renal arteries are noted on the left. IMA: Patent without evidence of aneurysm, dissection, vasculitis or significant stenosis. Inflow: Iliacs are within normal limits. Veins: No specific venous abnormality is noted. Review of the MIP images confirms the above findings. NON-VASCULAR Hepatobiliary: Fatty infiltration of the liver is noted. The gallbladder has been surgically removed. Pancreas: Unremarkable. No pancreatic ductal dilatation or surrounding inflammatory changes. Spleen: Normal in size without focal abnormality. Adrenals/Urinary Tract: Adrenal glands are within normal limits. Kidneys demonstrate a normal enhancement pattern bilaterally. No obstructive changes are seen. The bladder is well distended. Stomach/Bowel: Appendix is well visualized and within normal limits. No obstructive or inflammatory changes of the colon are noted. Small bowel and stomach are within normal limits. Lymphatic: No lymphadenopathy is noted. Reproductive: Uterus is within normal limits. No adnexal mass is seen. Other: No abdominal wall hernia or abnormality. No abdominopelvic ascites. Musculoskeletal: No acute or significant osseous findings. Review of the MIP images confirms the above findings. IMPRESSION: CTA of the chest: No aortic abnormality is noted. No pulmonary emboli are seen. CTA of the abdomen and pelvis: No acute arterial abnormality is noted. Fatty liver. Electronically Signed   By: Alcide Clever M.D.   On: 07/23/2022 03:29   DG Chest 2 View  Result Date: 07/23/2022  CLINICAL DATA:  Chest pain EXAM: CHEST - 2 VIEW COMPARISON:  08/27/2017 FINDINGS: The heart size and mediastinal contours are within normal limits. Both lungs are clear. The visualized skeletal structures are unremarkable.  IMPRESSION: No active cardiopulmonary disease. Electronically Signed   By: Jasmine Pang M.D.   On: 07/23/2022 00:27    Procedures Procedures    Medications Ordered in ED Medications  methocarbamol (ROBAXIN) tablet 500 mg (has no administration in time range)  dexamethasone (DECADRON) injection 10 mg (has no administration in time range)  ibuprofen (ADVIL) tablet 400 mg (400 mg Oral Given 07/23/22 0041)  HYDROcodone-acetaminophen (NORCO/VICODIN) 5-325 MG per tablet 1 tablet (1 tablet Oral Given 07/23/22 0041)  HYDROcodone-acetaminophen (NORCO/VICODIN) 5-325 MG per tablet 1 tablet (1 tablet Oral Given 07/23/22 0210)  fentaNYL (SUBLIMAZE) injection 100 mcg (100 mcg Intravenous Given 07/23/22 0231)  iohexol (OMNIPAQUE) 350 MG/ML injection 100 mL (100 mLs Intravenous Contrast Given 07/23/22 1610)    ED Course/ Medical Decision Making/ A&P Clinical Course as of 07/23/22 0404  Tue Jul 23, 2022  0048 Pt presents with left shoulder/back/neck/chest pain for months.  No acute neuro deficits noted.  Workup pending at this time. [DW]  0225 Patient reports worsening symptoms, she appears very uncomfortable.  She reports pain from her left shoulder into her chest and her left arm.  Patient is a difficult historian.  Due to worsening symptoms and her clinical appearance, will proceed with CT imaging to rule out aortic dissection. [DW]  0225 Of note, patient has mildly elevated hCG, but reports she is in menopause and no longer having periods.  This is not likely clinically significant [DW]  0402 Extensive workup does not reveal any acute cardiovascular emergency.  No signs of ACS or dissection. [DW]  9604 Patient still having pain in her left neck left shoulder and left arm.  Distal pulses equal and intact.  She has equal strength in both of her extremities.  Now having strong suspicion of this is a radiculopathy. [DW]  0403 This can be managed at home, will get PCP follow-up and may end up needing MRI.   Patient reports muscle spasms, will start Robaxin. [DW]    Clinical Course User Index [DW] Zadie Rhine, MD                             Medical Decision Making Amount and/or Complexity of Data Reviewed Labs: ordered. Radiology: ordered. ECG/medicine tests: ordered.  Risk Prescription drug management.   This patient presents to the ED for concern of neck, shoulder and chest pain, this involves an extensive number of treatment options, and is a complaint that carries with it a high risk of complications and morbidity.  The differential diagnosis includes but is not limited to acute coronary syndrome, aortic dissection, pulmonary embolism, pericarditis, pneumothorax, pneumonia, myocarditis, pleurisy, esophageal rupture   Comorbidities that complicate the patient evaluation: Patient's presentation is complicated by their history of obesity  Additional history obtained: Additional history obtained from spouse Records reviewed  outpatient records reviewed  Lab Tests: I Ordered, and personally interpreted labs.  The pertinent results include: Labs overall unremarkable  Imaging Studies ordered: I ordered imaging studies including X-ray chest   I independently visualized and interpreted imaging which showed no acute findings I agree with the radiologist interpretation  Cardiac Monitoring: The patient was maintained on a cardiac monitor.  I personally viewed and interpreted the cardiac monitor which showed an underlying rhythm of:  sinus rhythm  Medicines ordered and prescription drug management: I ordered medication including vicodin and ibuprofen  for pain  Reevaluation of the patient after these medicines showed that the patient    stayed the same   Reevaluation: After the interventions noted above, I reevaluated the patient and found that they have :improved  Complexity of problems addressed: Patient's presentation is most consistent with  acute presentation with potential  threat to life or bodily function  Disposition: After consideration of the diagnostic results and the patient's response to treatment,  I feel that the patent would benefit from discharge   .           Final Clinical Impression(s) / ED Diagnoses Final diagnoses:  Precordial pain  Cervical radiculopathy    Rx / DC Orders ED Discharge Orders          Ordered    methocarbamol (ROBAXIN) 500 MG tablet  2 times daily        07/23/22 0400              Zadie Rhine, MD 07/23/22 0405

## 2022-07-23 NOTE — ED Triage Notes (Signed)
Patient reports intermittent left chest pain radiating to left shoulder and left upper back for several weeks , mild SOB , occasional emesis / no diaphoresis .

## 2022-07-23 NOTE — Discharge Instructions (Addendum)
You have neck pain, possibly from a cervical strain and/or pinched nerve.  ° °SEEK IMMEDIATE MEDICAL ATTENTION IF: °You develop difficulties swallowing or breathing.  °You have new or worse numbness, weakness, tingling, or movement problems in your arms or legs.  °You develop increasing pain which is uncontrolled with medications.  °You have change in bowel or bladder function, or other concerns. ° ° ° °

## 2022-07-23 NOTE — ED Notes (Signed)
Patient transported to X-ray 

## 2022-07-23 NOTE — ED Provider Notes (Signed)
Patient with mildly elevated hCG, reports she is in menopause, this is not likely clinically significant, she is not likely pregnant   Zadie Rhine, MD 07/23/22 [phone]

## 2022-08-07 ENCOUNTER — Ambulatory Visit
Admission: RE | Admit: 2022-08-07 | Discharge: 2022-08-07 | Disposition: A | Discharge status: Home / Self Care | Payer: Managed Care, Other (non HMO) | Source: Ambulatory Visit | Attending: Nurse Practitioner

## 2022-08-07 ENCOUNTER — Encounter: Payer: Self-pay | Admitting: Nurse Practitioner

## 2022-08-07 ENCOUNTER — Ambulatory Visit: Payer: Managed Care, Other (non HMO) | Attending: Nurse Practitioner | Admitting: Nurse Practitioner

## 2022-08-07 VITALS — BP 116/77 | HR 95 | Ht 62.0 in | Wt 234.8 lb

## 2022-08-07 DIAGNOSIS — Z6841 Body Mass Index (BMI) 40.0 and over, adult: Secondary | ICD-10-CM

## 2022-08-07 DIAGNOSIS — M5412 Radiculopathy, cervical region: Secondary | ICD-10-CM

## 2022-08-07 DIAGNOSIS — E7800 Pure hypercholesterolemia, unspecified: Secondary | ICD-10-CM

## 2022-08-07 DIAGNOSIS — M2550 Pain in unspecified joint: Secondary | ICD-10-CM

## 2022-08-07 DIAGNOSIS — Z1211 Encounter for screening for malignant neoplasm of colon: Secondary | ICD-10-CM

## 2022-08-07 DIAGNOSIS — Z7689 Persons encountering health services in other specified circumstances: Secondary | ICD-10-CM

## 2022-08-07 DIAGNOSIS — Z1231 Encounter for screening mammogram for malignant neoplasm of breast: Secondary | ICD-10-CM

## 2022-08-07 DIAGNOSIS — E559 Vitamin D deficiency, unspecified: Secondary | ICD-10-CM

## 2022-08-07 MED ORDER — PREDNISONE 10 MG PO TABS
ORAL_TABLET | ORAL | 0 refills | Status: DC
Start: 2022-08-07 — End: 2023-04-14

## 2022-08-07 MED ORDER — CELECOXIB 50 MG PO CAPS
50.0000 mg | ORAL_CAPSULE | Freq: Two times a day (BID) | ORAL | 1 refills | Status: DC
Start: 2022-08-07 — End: 2022-09-04

## 2022-08-07 NOTE — Progress Notes (Signed)
Assessment & Plan:  [name] was seen today for establish care.  Diagnoses and all orders for this visit:  Encounter to establish care  Arthralgia of multiple joints -     Arthritis Panel  Cervical radiculopathy -     DG Cervical Spine Complete; Future -     predniSONE (DELTASONE) 10 MG tablet; Take 4 tablets (40 mg total) by mouth daily with breakfast for 5 days, THEN 3 tablets (30 mg total) daily with breakfast for 4 days, THEN 2 tablets (20 mg total) daily with breakfast for 4 days, THEN 1 tablet (10 mg total) daily with breakfast for 4 days, THEN 0.5 tablets (5 mg total) daily with breakfast for 4 days. -     celecoxib (CELEBREX) 50 MG capsule; Take 1 capsule (50 mg total) by mouth 2 (two) times daily. Do not take until after you complete prednisone  Screening for colon cancer -     Ambulatory referral to Gastroenterology  Breast cancer screening by mammogram -     MM 3D SCREENING MAMMOGRAM BILATERAL BREAST; Future  Vitamin D deficiency disease -     VITAMIN D 25 Hydroxy (Vit-D Deficiency, Fractures)  Morbid obesity with BMI of 40.0-44.9, adult (HCC) -     Lipid panel  Low calcium levels -     CMP14+EGFR     Patient has been counseled on age-appropriate routine health concerns for screening and prevention. These are reviewed and up-to-date. Referrals have been placed accordingly. Immunizations are up-to-date or declined.    Subjective:   Chief Complaint  Patient presents with   Establish Care   HPI [name] 52 y.o. female presents to office today to establish care.   She has a past medical history of Gallstones and Obesity (BMI 30-39.9).   Patient has been counseled on age-appropriate routine health concerns for screening and prevention. These are reviewed and up-to-date. Referrals have been placed accordingly. Immunizations are up-to-date or declined.     PAP SMEAR:OVERDUE. Schedule for future office visti MAMMOGRAM: OVERDUE: Scheduled for 09-03-2022 COLON  CANCER SCREENING: OVERDUE: Referral placed.   She endorses generalized arthralgias. She has a history of cervical radiculopathy and chronic low back pain.  Patient reports for the past several months she has had pain in her left shoulder, left neck, left scapular region with numbness and tingling in the left arm. She denies any accident or trauma. She does have a history of ORIF of left fibula (04-2020) due to fracture she sustained at home from a fall.    Review of Systems  Constitutional:  Negative for fever, malaise/fatigue and weight loss.  HENT: Negative.  Negative for nosebleeds.   Eyes: Negative.  Negative for blurred vision, double vision and photophobia.  Respiratory: Negative.  Negative for cough and shortness of breath.   Cardiovascular: Negative.  Negative for chest pain, palpitations and leg swelling.  Gastrointestinal: Negative.  Negative for heartburn, nausea and vomiting.  Musculoskeletal:  Positive for joint pain and neck pain. Negative for myalgias.  Neurological: Negative.  Negative for dizziness, focal weakness, seizures and headaches.  Psychiatric/Behavioral: Negative.  Negative for suicidal ideas.     Past Medical History:  Diagnosis Date   Gallstones    No pertinent past medical history    Obesity (BMI 30-39.9)     Past Surgical History:  Procedure Laterality Date   CHOLECYSTECTOMY  02/20/2011   Procedure: LAPAROSCOPIC CHOLECYSTECTOMY WITH INTRAOPERATIVE CHOLANGIOGRAM;  Surgeon: Kandis Cocking, MD;  Location: [facility] OR;  Service: General;  Laterality:  N/A;   TUBAL LIGATION      Family History  Problem Relation Age of Onset   Diabetes Mother    Heart disease Mother    Diabetes Father    Heart disease Father    Diabetes Paternal Grandmother    Diabetes Paternal Grandfather    Diabetes Maternal Grandmother    Diabetes Maternal Grandfather    Colon cancer Neg Hx    Rectal cancer Neg Hx    Stomach cancer Neg Hx    Esophageal cancer Neg Hx     Social History  Reviewed with no changes to be made today.   Outpatient Medications Prior to Visit  Medication Sig Dispense Refill   meloxicam (MOBIC) 15 MG tablet Take 1 tablet (15 mg total) by mouth daily. 30 tablet 1   methocarbamol (ROBAXIN) 500 MG tablet Take 1 tablet (500 mg total) by mouth 2 (two) times daily. 20 tablet 0   No facility-administered medications prior to visit.    No Known Allergies     Objective:    BP 116/77 (BP Location: Left Arm, Patient Position: Sitting, Cuff Size: Normal) Comment (BP Location): lower left arm  Pulse 95   Ht 5\' 2"  (1.575 m)   Wt 234 lb 12.8 oz (106.5 kg)   LMP 08/22/2017 Comment: has had tubal ligation  SpO2 95%   BMI 42.95 kg/m  Wt Readings from Last 3 Encounters:  08/07/22 234 lb 12.8 oz (106.5 kg)  12/25/16 223 lb (101.2 kg)  08/06/16 224 lb (101.6 kg)    Physical Exam Vitals and nursing note reviewed.  Constitutional:      Appearance: She is well-developed.  HENT:     Head: Normocephalic and atraumatic.  Cardiovascular:     Rate and Rhythm: Normal rate and regular rhythm.     Heart sounds: Normal heart sounds. No murmur heard.    No friction rub. No gallop.  Pulmonary:     Effort: Pulmonary effort is normal. No tachypnea or respiratory distress.     Breath sounds: Normal breath sounds. No decreased breath sounds, wheezing, rhonchi or rales.  Chest:     Chest wall: No tenderness.  Abdominal:     General: Bowel sounds are normal.     Palpations: Abdomen is soft.  Musculoskeletal:        General: Normal range of motion.     Cervical back: Normal range of motion.  Skin:    General: Skin is warm and dry.  Neurological:     Mental Status: She is alert and oriented to person, place, and time.     Coordination: Coordination normal.  Psychiatric:        Behavior: Behavior normal. Behavior is cooperative.        Thought Content: Thought content normal.        Judgment: Judgment normal.          Patient has been counseled  extensively about nutrition and exercise as well as the importance of adherence with medications and regular follow-up. The patient was given clear instructions to go to ER or return to medical center if symptoms don't improve, worsen or new problems develop. The patient verbalized understanding.   Follow-up: Return for 3-4 weeks Neck Pain.   Claiborne Rigg, FNP-BC [facility] [place], [place] [phone]   08/09/2022, 10:13 PM

## 2022-08-07 NOTE — Progress Notes (Signed)
Robaxin not working for upper back and down left shoulder to arm pain

## 2022-08-08 LAB — CMP14+EGFR
ALT: 13 IU/L (ref 0–32)
AST: 15 IU/L (ref 0–40)
Albumin/Globulin Ratio: 1.2 (ref 1.2–2.2)
Albumin: 3.9 g/dL (ref 3.9–4.9)
Alkaline Phosphatase: 113 IU/L (ref 44–121)
BUN/Creatinine Ratio: 17 (ref 9–23)
BUN: 12 mg/dL (ref 6–24)
Bilirubin Total: <0.2 mg/dL (ref 0.0–1.2)
CO2: 18 mmol/L — ABNORMAL LOW (ref 20–29)
Calcium: 8.9 mg/dL (ref 8.7–10.2)
Chloride: 104 mmol/L (ref 96–106)
Creatinine, Ser: 0.69 mg/dL (ref 0.57–1.00)
Globulin, Total: 3.3 g/dL (ref 1.5–4.5)
Glucose: 128 mg/dL — ABNORMAL HIGH (ref 70–99)
Potassium: 4 mmol/L (ref 3.5–5.2)
Sodium: 140 mmol/L (ref 134–144)
Total Protein: 7.2 g/dL (ref 6.0–8.5)
eGFR: 106 mL/min/{1.73_m2} (ref >59)

## 2022-08-08 LAB — LIPID PANEL
Chol/HDL Ratio: 7.2 ratio — ABNORMAL HIGH (ref 0.0–4.4)
Cholesterol, Total: 244 mg/dL — ABNORMAL HIGH (ref 100–199)
HDL: 34 mg/dL — ABNORMAL LOW (ref >39)
LDL Chol Calc (NIH): 125 mg/dL — ABNORMAL HIGH (ref 0–99)
Triglycerides: 476 mg/dL — ABNORMAL HIGH (ref 0–149)
VLDL Cholesterol Cal: 85 mg/dL — ABNORMAL HIGH (ref 5–40)

## 2022-08-08 LAB — ARTHRITIS PANEL
Anti Nuclear Antibody (ANA): NEGATIVE
Rheumatoid fact SerPl-aCnc: <10 IU/mL (ref <14.0)
Sed Rate: 21 mm/hr (ref 0–40)
Uric Acid: 7 mg/dL — ABNORMAL HIGH (ref 2.6–6.2)

## 2022-08-08 LAB — VITAMIN D 25 HYDROXY (VIT D DEFICIENCY, FRACTURES): Vit D, 25-Hydroxy: 10.8 ng/mL — ABNORMAL LOW (ref 30.0–100.0)

## 2022-08-09 ENCOUNTER — Encounter: Payer: Self-pay | Admitting: Nurse Practitioner

## 2022-08-09 MED ORDER — VITAMIN D (ERGOCALCIFEROL) 1.25 MG (50000 UNIT) PO CAPS: 50000.0000 [IU] | ORAL_CAPSULE | ORAL | 0 refills | Status: AC

## 2022-08-09 MED ORDER — ATORVASTATIN CALCIUM 20 MG PO TABS
20.0000 mg | ORAL_TABLET | Freq: Every day | ORAL | 3 refills | Status: AC
Start: 2022-08-09 — End: ?

## 2022-08-14 ENCOUNTER — Other Ambulatory Visit: Payer: Self-pay | Admitting: Nurse Practitioner

## 2022-08-14 DIAGNOSIS — M5412 Radiculopathy, cervical region: Secondary | ICD-10-CM

## 2022-08-28 ENCOUNTER — Encounter: Payer: Self-pay | Admitting: Physical Medicine and Rehabilitation

## 2022-08-30 ENCOUNTER — Other Ambulatory Visit: Payer: Self-pay | Admitting: Nurse Practitioner

## 2022-08-30 DIAGNOSIS — M5412 Radiculopathy, cervical region: Secondary | ICD-10-CM

## 2022-09-03 ENCOUNTER — Ambulatory Visit
Admission: RE | Admit: 2022-09-03 | Discharge: 2022-09-03 | Disposition: A | Discharge status: Home / Self Care | Payer: Managed Care, Other (non HMO) | Source: Ambulatory Visit | Attending: Nurse Practitioner

## 2022-09-03 DIAGNOSIS — Z1231 Encounter for screening mammogram for malignant neoplasm of breast: Secondary | ICD-10-CM

## 2022-09-04 ENCOUNTER — Ambulatory Visit: Payer: Managed Care, Other (non HMO) | Attending: Nurse Practitioner | Admitting: Nurse Practitioner

## 2022-09-04 ENCOUNTER — Encounter: Payer: Self-pay | Admitting: Nurse Practitioner

## 2022-09-04 DIAGNOSIS — M5412 Radiculopathy, cervical region: Secondary | ICD-10-CM | POA: Diagnosis not present

## 2022-09-04 MED ORDER — CELECOXIB 100 MG PO CAPS
100.0000 mg | ORAL_CAPSULE | Freq: Two times a day (BID) | ORAL | 0 refills | Status: AC
Start: 2022-09-04 — End: ?

## 2022-09-04 NOTE — Progress Notes (Signed)
Assessment & Plan:  [name] was seen today for neck pain.  Diagnoses and all orders for this visit:  Cervical radiculopathy DOSE CHANGE -     celecoxib (CELEBREX) 100 MG capsule; Take 1 capsule (100 mg total) by mouth 2 (two) times daily. Follow up with PMR   Patient has been counseled on age-appropriate routine health concerns for screening and prevention. These are reviewed and up-to-date. Referrals have been placed accordingly. Immunizations are up-to-date or declined.    Subjective:   Chief Complaint  Patient presents with   Neck Pain   Neck Pain  Pertinent negatives include no chest pain, fever, headaches, photophobia or weight loss.   [name] 52 y.o. female presents to office today for follow up to neck pain.    She has a past medical history of Gallstones and Obesity (BMI 30-39.9).   She was prescribed prednisone and celebrex at her last visit with me for cervical radiculopathy and chronic low back pain. At that time she was also referred to physical medicine and rehab (She now has an appt scheduled for 6-24).  Patient reports for the past several months she has had pain in her left shoulder, left neck, left scapular region with numbness and tingling in the left arm. She denies any accident or trauma. She does have a history of ORIF of left fibula (04-2020) due to fracture she sustained at home from a fall.  Today she reports not much improvement in neck or back pain with celebrex 50 mg BID.   Review of Systems  Constitutional:  Negative for fever, malaise/fatigue and weight loss.  HENT: Negative.  Negative for nosebleeds.   Eyes: Negative.  Negative for blurred vision, double vision and photophobia.  Respiratory: Negative.  Negative for cough and shortness of breath.   Cardiovascular: Negative.  Negative for chest pain, palpitations and leg swelling.  Gastrointestinal: Negative.  Negative for heartburn, nausea and vomiting.  Musculoskeletal:  Positive for back  pain and neck pain. Negative for myalgias.  Neurological: Negative.  Negative for dizziness, focal weakness, seizures and headaches.  Psychiatric/Behavioral: Negative.  Negative for suicidal ideas.     Past Medical History:  Diagnosis Date   Gallstones    No pertinent past medical history    Obesity (BMI 30-39.9)     Past Surgical History:  Procedure Laterality Date   CHOLECYSTECTOMY  02/20/2011   Procedure: LAPAROSCOPIC CHOLECYSTECTOMY WITH INTRAOPERATIVE CHOLANGIOGRAM;  Surgeon: Kandis Cocking, MD;  Location: [facility] OR;  Service: General;  Laterality: N/A;   TUBAL LIGATION      Family History  Problem Relation Age of Onset   Diabetes Mother    Heart disease Mother    Diabetes Father    Heart disease Father    Diabetes Paternal Grandmother    Diabetes Paternal Grandfather    Diabetes Maternal Grandmother    Diabetes Maternal Grandfather    Colon cancer Neg Hx    Rectal cancer Neg Hx    Stomach cancer Neg Hx    Esophageal cancer Neg Hx     Social History Reviewed with no changes to be made today.   Outpatient Medications Prior to Visit  Medication Sig Dispense Refill   atorvastatin (LIPITOR) 20 MG tablet Take 1 tablet (20 mg total) by mouth daily. 90 tablet 3   predniSONE (DELTASONE) 10 MG tablet Take 4 tablets (40 mg total) by mouth daily with breakfast for 5 days, THEN 3 tablets (30 mg total) daily with breakfast for 4 days, THEN  2 tablets (20 mg total) daily with breakfast for 4 days, THEN 1 tablet (10 mg total) daily with breakfast for 4 days, THEN 0.5 tablets (5 mg total) daily with breakfast for 4 days. 46 tablet 0   Vitamin D, Ergocalciferol, (DRISDOL) 1.25 MG (50000 UNIT) CAPS capsule Take 1 capsule (50,000 Units total) by mouth every 7 (seven) days. Take once a week. Please fill as a 90 day supply 12 capsule 0   celecoxib (CELEBREX) 50 MG capsule Take 1 capsule (50 mg total) by mouth 2 (two) times daily. Do not take until after you complete prednisone 60 capsule 1    No facility-administered medications prior to visit.    No Known Allergies     Objective:    BP 115/77 (BP Location: Left Arm, Patient Position: Sitting, Cuff Size: Large)   Pulse 80   Ht 5\' 2"  (1.575 m)   Wt 232 lb (105.2 kg)   LMP 08/22/2017 Comment: has had tubal ligation  SpO2 97%   BMI 42.43 kg/m  Wt Readings from Last 3 Encounters:  09/04/22 232 lb (105.2 kg)  08/07/22 234 lb 12.8 oz (106.5 kg)  12/25/16 223 lb (101.2 kg)    Physical Exam Vitals and nursing note reviewed.  Constitutional:      Appearance: She is well-developed.  HENT:     Head: Normocephalic and atraumatic.  Cardiovascular:     Rate and Rhythm: Normal rate and regular rhythm.     Heart sounds: Normal heart sounds. No murmur heard.    No friction rub. No gallop.  Pulmonary:     Effort: Pulmonary effort is normal. No tachypnea or respiratory distress.     Breath sounds: Normal breath sounds. No decreased breath sounds, wheezing, rhonchi or rales.  Chest:     Chest wall: No tenderness.  Abdominal:     General: Bowel sounds are normal.     Palpations: Abdomen is soft.  Musculoskeletal:        General: Normal range of motion.     Cervical back: Normal range of motion.  Skin:    General: Skin is warm and dry.  Neurological:     Mental Status: She is alert and oriented to person, place, and time.     Coordination: Coordination normal.  Psychiatric:        Behavior: Behavior normal. Behavior is cooperative.        Thought Content: Thought content normal.        Judgment: Judgment normal.          Patient has been counseled extensively about nutrition and exercise as well as the importance of adherence with medications and regular follow-up. The patient was given clear instructions to go to ER or return to medical center if symptoms don't improve, worsen or new problems develop. The patient verbalized understanding.   Follow-up: Return in about 6 months (around 03/06/2023) for PAP SMEAR.    Claiborne Rigg, FNP-BC [facility] [place], [place] [phone]   09/04/2022, 2:12 PM

## 2022-09-04 NOTE — Progress Notes (Signed)
Neck pain still the same.

## 2022-09-16 ENCOUNTER — Encounter
Payer: Managed Care, Other (non HMO) | Attending: Physical Medicine and Rehabilitation | Admitting: Physical Medicine and Rehabilitation

## 2022-09-16 VITALS — BP 133/85 | HR 86 | Ht 62.0 in | Wt 232.0 lb

## 2022-09-16 DIAGNOSIS — G8929 Other chronic pain: Secondary | ICD-10-CM | POA: Diagnosis present

## 2022-09-16 DIAGNOSIS — R29898 Other symptoms and signs involving the musculoskeletal system: Secondary | ICD-10-CM | POA: Diagnosis present

## 2022-09-16 DIAGNOSIS — G894 Chronic pain syndrome: Secondary | ICD-10-CM | POA: Diagnosis present

## 2022-09-16 DIAGNOSIS — R202 Paresthesia of skin: Secondary | ICD-10-CM | POA: Insufficient documentation

## 2022-09-16 DIAGNOSIS — M542 Cervicalgia: Secondary | ICD-10-CM | POA: Insufficient documentation

## 2022-09-16 DIAGNOSIS — R200 Anesthesia of skin: Secondary | ICD-10-CM | POA: Diagnosis present

## 2022-09-16 MED ORDER — GABAPENTIN 300 MG PO CAPS: 300.0000 mg | ORAL_CAPSULE | Freq: Three times a day (TID) | ORAL | 3 refills | Status: DC

## 2022-09-16 NOTE — Progress Notes (Signed)
Subjective:    Patient ID: [name], female    DOB: 19-Jul-1970, 52 y.o.   MRN: [id number]  HPI HPI  [name] is a 52 y.o. year old female  who  has a past medical history of Gallstones, No pertinent past medical history, and Obesity (BMI 30-39.9).   They are presenting to PM&R clinic as a new patient for pain management evaluation. They were referred by NP Loreen Freud for treatment of neck pain.   Per her last note:   She was prescribed prednisone and celebrex at her last visit with me for cervical radiculopathy and chronic low back pain. At that time she was also referred to physical medicine and rehab (She now has an appt scheduled for 6-24).  Patient reports for the past several months she has had pain in her left shoulder, left neck, left scapular region with numbness and tingling in the left arm. She denies any accident or trauma.   She does have a history of ORIF of left fibula (04-2020) due to fracture she sustained at home from a fall.   Today she reports not much improvement in neck or back pain with celebrex 50 mg BID.    Xray cervical spine 08/07/22:  FINDINGS: No fracture, dislocation or subluxation. No spondylolisthesis. No osteolytic or osteoblastic changes. Prevertebral and cervical cranial soft tissues are unremarkable.   Degenerative disc disease noted with disc space narrowing and marginal osteophytes at C5-C6.   IMPRESSION: Degenerative changes. No acute osseous abnormalities.    Source: Starts in her neck and shoots into her left elbow and all fingers Inciting incident: none  Description of pain: constant and numbness/tingling/stabbing Exacerbating factors: none Remitting factors: massage Red flag symptoms: No red flags for back pain endorsed in Hx or ROS  Medications tried: Topical medications (never tried) :  Nsaids (mild effect) : celebrex and ibuprofen Tylenol  (mild effect) :  Opiates  (unsure of effect) : Was on oxycodone in the  past; unsure why; no s/e Gabapentin / Lyrica  (never tried) :  TCAs  (never tried) :  SNRIs  (never tried) :  Other  (unsure of effect) : took robaxin 500 mg once; "she stopped complaining of her arm when we got that"  Other treatments: PT/OT  (never tried) :  Accupuncture/chiropractor/massage  (moderate effect) : Massage at home helps TENs unit (never tried) :  Injections (never tried) :  Surgery (never tried) :  Other  (never tried) :   Goals for pain control: "everything";   Prior UDS results:     Component Value Date/Time   LABOPIA NONE DETECTED 08/04/2012 0914   COCAINSCRNUR NONE DETECTED 08/04/2012 0914   LABBENZ NONE DETECTED 08/04/2012 0914   AMPHETMU NONE DETECTED 08/04/2012 0914   THCU NONE DETECTED 08/04/2012 0914   LABBARB NONE DETECTED 08/04/2012 0914      Pain Inventory Average Pain 3 Pain Right Now 3 My pain is constant, stabbing, tingling  In the last 24 hours, has pain interfered with the following? General activity 5 Relation with others 0 Enjoyment of life 0 What TIME of day is your pain at its worst? varies Sleep (in general) Poor  Pain is worse with: some activites Pain improves with: therapy/exercise Relief from Meds: 9  walk without assistance how many minutes can you walk? 30 ability to climb steps?  no do you drive?  yes Do you have any goals in this area?  no  not employed: date last employed unknown  bladder control  problems bowel control problems weakness numbness  N/a  New patient    Family History  Problem Relation Age of Onset   Diabetes Mother    Heart disease Mother    Diabetes Father    Heart disease Father    Diabetes Paternal Grandmother    Diabetes Paternal Grandfather    Diabetes Maternal Grandmother    Diabetes Maternal Grandfather    Colon cancer Neg Hx    Rectal cancer Neg Hx    Stomach cancer Neg Hx    Esophageal cancer Neg Hx    Social History   Socioeconomic History   Marital status: Married     Spouse name: Not on file   Number of children: 4   Years of education: Not on file   Highest education level: Not on file  Occupational History   Not on file  Tobacco Use   Smoking status: Former    Types: Cigarettes    Quit date: 03/11/2011    Years since quitting: 11.5   Smokeless tobacco: Current    Types: Snuff  Vaping Use   Vaping Use: Never used  Substance and Sexual Activity   Alcohol use: No   Drug use: No   Sexual activity: Yes  Other Topics Concern   Not on file  Social History Narrative   Not on file   Social Determinants of Health   Financial Resource Strain: Not on file  Food Insecurity: Not on file  Transportation Needs: Not on file  Physical Activity: Not on file  Stress: Not on file  Social Connections: Not on file   Past Surgical History:  Procedure Laterality Date   CHOLECYSTECTOMY  02/20/2011   Procedure: LAPAROSCOPIC CHOLECYSTECTOMY WITH INTRAOPERATIVE CHOLANGIOGRAM;  Surgeon: Kandis Cocking, MD;  Location: [facility] OR;  Service: General;  Laterality: N/A;   TUBAL LIGATION     Past Medical History:  Diagnosis Date   Gallstones    No pertinent past medical history    Obesity (BMI 30-39.9)    BP 133/85   Pulse 86   Ht 5\' 2"  (1.575 m)   Wt 232 lb (105.2 kg)   LMP 08/22/2017 Comment: has had tubal ligation  SpO2 93%   BMI 42.43 kg/m   Opioid Risk Score:   Fall Risk Score:  `1  Depression screen PHQ 2/9     09/16/2022    2:09 PM 09/04/2022    1:58 PM 08/07/2022    2:44 PM 08/06/2016   10:27 AM 05/30/2016    9:56 AM 05/16/2016    9:08 AM  Depression screen PHQ 2/9  Decreased Interest 1 2 0 0 0 0  Down, Depressed, Hopeless 0 0 0 0 1 0  PHQ - 2 Score 1 2 0 0 1 0  Altered sleeping 3 3 3 3 3    Tired, decreased energy 3 3 3 3 2    Change in appetite 3 3 3 3 2    Feeling bad or failure about yourself  0 0 0 0 0   Trouble concentrating 0 0 0 0 0   Moving slowly or fidgety/restless 0 0 0 0 0   Suicidal thoughts 0 1 0 0 0   PHQ-9 Score 10 12 9 9 8       Review of Systems  Respiratory:  Positive for shortness of breath.   Gastrointestinal:  Positive for constipation.  Musculoskeletal:  Positive for back pain and neck pain.       B/l shoulder arm side pain Lt foot  pain  All other systems reviewed and are negative.      Objective:   Physical Exam   PE: Constitution: Appropriate appearance for age. No apparent distress. +Obese  Resp: No respiratory distress. No accessory muscle usage. on RA and CTAB Cardio: Well perfused appearance. No peripheral edema. Abdomen: Nondistended. Nontender.   Psych: Appropriate mood and affect. Neuro: AAOx4. No apparent cognitive deficits   Neurologic Exam:   DTRs: Reflexes were 2+ in bilateral achilles, patella, biceps, BR and triceps. Babinsky: flexor responses b/l.   Hoffmans: negative b/l Sensory exam: revealed normal sensation in all dermatomal regions in right upper extremity and with reduced sensation to light touch in left upper extremity C4-T1 Motor exam: strength 5/5 throughout bilateral Ues except 4/5 bilateral finger abduction.  Coordination: Fine motor coordination was normal.   Gait: normal Good AROM all planes of neck. + Spurling's bilaterally for L arm pain - Tinel's at L elbow and wrist       Assessment & Plan:   [name] is a 52 y.o. year old female  who  has a past medical history of Gallstones, No pertinent past medical history, and Obesity (BMI 30-39.9).   They are presenting to PM&R clinic as a new patient for treatment of neck pain with neuropathic pain radiating down her LUE .    Chronic pain syndrome Numbness and tingling in left arm Left hand weakness Chronic neck pain C spine xray 5/24: Degenerative disc disease noted with disc space narrowing and marginal osteophytes at C5-C6.  Continue current medications and follow-up with your primary doctor for repeat uric acid levels to evaluate for gout.  Start Gabapentin 300 mg 1 capsule at nighttime for 1 week. If  tolerating with no side effects, increase to twice daily. If after one week no side effects, increase to three times daily.   I am going to schedule you for an EMG of your left upper extremity to look for cervical radiculopathy versus entrapment neuropathy, likely ulnar.  This will be scheduled with either myself or Dr. Wynn Banker.  2 weeks after study, you will follow-up with me to go over results and discuss further pain management.

## 2022-09-16 NOTE — Patient Instructions (Signed)
Numbness and tingling in left arm Left hand weakness Chronic neck pain Continue current medications and follow-up with your primary doctor for repeat uric acid levels to evaluate for gout.  Start Gabapentin 300 mg 1 capsule at nighttime for 1 week. If tolerating with no side effects, increase to twice daily. If after one week no side effects, increase to three times daily.   I am going to schedule you for an EMG of your left upper extremity to look for cervical radiculopathy versus entrapment neuropathy, likely ulnar.  This will be scheduled with either myself or Dr. Wynn Banker.  2 weeks after study, you will follow-up with me to go over results and discuss further pain management.

## 2022-09-20 ENCOUNTER — Encounter: Payer: Self-pay | Admitting: Family Medicine

## 2022-11-14 ENCOUNTER — Encounter
Payer: Managed Care, Other (non HMO) | Attending: Physical Medicine and Rehabilitation | Admitting: Physical Medicine & Rehabilitation

## 2022-11-14 ENCOUNTER — Encounter: Payer: Self-pay | Admitting: Physical Medicine & Rehabilitation

## 2022-11-14 VITALS — BP 108/78 | HR 93 | Ht 62.0 in | Wt 230.0 lb

## 2022-11-14 DIAGNOSIS — R200 Anesthesia of skin: Secondary | ICD-10-CM | POA: Diagnosis present

## 2022-11-14 DIAGNOSIS — R202 Paresthesia of skin: Secondary | ICD-10-CM | POA: Insufficient documentation

## 2022-11-14 NOTE — Patient Instructions (Signed)
You will discuss results of EMG with Dr Shearon Stalls

## 2022-11-14 NOTE — Progress Notes (Signed)
Pt referred for EMG/NCV LUE to evaluate neck pain with shooting pains into the left upper extremity.  Differential includes entrapment neuropathy vs cervical radiculopathy.  Please refer to media tab for full report.  Pt will follow up with Dr Shearon Stalls to discuss results.

## 2022-11-30 ENCOUNTER — Other Ambulatory Visit: Payer: Self-pay | Admitting: Nurse Practitioner

## 2022-11-30 DIAGNOSIS — M5412 Radiculopathy, cervical region: Secondary | ICD-10-CM

## 2022-12-22 NOTE — Progress Notes (Signed)
[name] is a 52 y.o. year old female  who  has a past medical history of Gallstones, No pertinent past medical history, and Obesity (BMI 30-39.9).   They are presenting to PM&R clinic for follow up related to LUE neuropathic pain and neck pain .  Plan from last visit:  Chronic pain syndrome Numbness and tingling in left arm Left hand weakness Chronic neck pain C spine xray 5/24: Degenerative disc disease noted with disc space narrowing and marginal osteophytes at C5-C6.   Continue current medications and follow-up with your primary doctor for repeat uric acid levels to evaluate for gout.   Start Gabapentin 300 mg 1 capsule at nighttime for 1 week. If tolerating with no side effects, increase to twice daily. If after one week no side effects, increase to three times daily.    I am going to schedule you for an EMG of your left upper extremity to look for cervical radiculopathy versus entrapment neuropathy, likely ulnar.  This will be scheduled with either myself or Dr. Wynn Banker.   2 weeks after study, you will follow-up with me to go over results and discuss further pain management.  Interval Hx:  - Therapies: None current, does not do HEP. Is not limited in her functional activities due to numbness or weakness.    - Follow ups: Had EMG LUE with Dr. Wynn Banker 11/14/22- NR all sensory nerves but motor WNL; no other limbs studied. L deltoid (C5-6) with 1+ PSW only, not seen on other muscles.    - Medications: Gabapentin 300 mg #60 tabs filled 1x 6/28. She says she stopped it because there was no change in her pain whatsoever. She had no side effects.    - Other concerns: Numbness in her left hand effecting all fingers and extending to her neck; also starting to have symptoms in her right arm. No notable associated activities or positions. No remitting factors. Does seem to be worse at nighttime and first thing in the morning.    Pain Inventory Average Pain 6 Pain Right Now 3 My pain  is  .  In the last 24 hours, has pain interfered with the following? General activity 0 Relation with others 0 Enjoyment of life 0 What TIME of day is your pain at its worst? varies Sleep (in general) Poor  Pain is worse with:  . Pain improves with:  . Relief from Meds: 1  Family History  Problem Relation Age of Onset   Diabetes Mother    Heart disease Mother    Diabetes Father    Heart disease Father    Diabetes Paternal Grandmother    Diabetes Paternal Grandfather    Diabetes Maternal Grandmother    Diabetes Maternal Grandfather    Colon cancer Neg Hx    Rectal cancer Neg Hx    Stomach cancer Neg Hx    Esophageal cancer Neg Hx    Social History   Socioeconomic History   Marital status: Married    Spouse name: Not on file   Number of children: 4   Years of education: Not on file   Highest education level: Not on file  Occupational History   Not on file  Tobacco Use   Smoking status: Former    Current packs/day: 0.00    Types: Cigarettes    Quit date: 03/11/2011    Years since quitting: 11.7   Smokeless tobacco: Current    Types: Snuff  Vaping Use   Vaping status: Never Used  Substance  and Sexual Activity   Alcohol use: No   Drug use: No   Sexual activity: Yes  Other Topics Concern   Not on file  Social History Narrative   Not on file   Social Determinants of Health   Financial Resource Strain: Not on file  Food Insecurity: Not on file  Transportation Needs: Not on file  Physical Activity: Not on file  Stress: Not on file  Social Connections: Not on file   Past Surgical History:  Procedure Laterality Date   CHOLECYSTECTOMY  02/20/2011   Procedure: LAPAROSCOPIC CHOLECYSTECTOMY WITH INTRAOPERATIVE CHOLANGIOGRAM;  Surgeon: Kandis Cocking, MD;  Location: [facility] OR;  Service: General;  Laterality: N/A;   TUBAL LIGATION     Past Surgical History:  Procedure Laterality Date   CHOLECYSTECTOMY  02/20/2011   Procedure: LAPAROSCOPIC CHOLECYSTECTOMY WITH  INTRAOPERATIVE CHOLANGIOGRAM;  Surgeon: Kandis Cocking, MD;  Location: [facility] OR;  Service: General;  Laterality: N/A;   TUBAL LIGATION     Past Medical History:  Diagnosis Date   Gallstones    No pertinent past medical history    Obesity (BMI 30-39.9)    BP 112/67   Pulse 87   Ht 5\' 2"  (1.575 m)   Wt 232 lb (105.2 kg)   LMP 08/22/2017 Comment: has had tubal ligation  SpO2 97%   BMI 42.43 kg/m   Opioid Risk Score:   Fall Risk Score:  `1  Depression screen [facility] 2/9     11/14/2022    2:48 PM 09/16/2022    2:09 PM 09/04/2022    1:58 PM 08/07/2022    2:44 PM 08/06/2016   10:27 AM 05/30/2016    9:56 AM 05/16/2016    9:08 AM  Depression screen PHQ 2/9  Decreased Interest 0 1 2 0 0 0 0  Down, Depressed, Hopeless 0 0 0 0 0 1 0  PHQ - 2 Score 0 1 2 0 0 1 0  Altered sleeping  3 3 3 3 3    Tired, decreased energy  3 3 3 3 2    Change in appetite  3 3 3 3 2    Feeling bad or failure about yourself   0 0 0 0 0   Trouble concentrating  0 0 0 0 0   Moving slowly or fidgety/restless  0 0 0 0 0   Suicidal thoughts  0 1 0 0 0   PHQ-9 Score  10 12 9 9 8      PE:  PE: Constitution: Appropriate appearance for age. No apparent distress +Obese Resp: No respiratory distress. No accessory muscle usage. on RA and CTAB Cardio: Well perfused appearance. No peripheral edema. Abdomen: Nondistended. Nontender.   Psych: Appropriate mood and affect. Neuro: AAOx4. No apparent cognitive deficits   Neurologic Exam:   DTRs: Reflexes were 2+ in bilateral biceps, BR and triceps. Hoffmans: negative b/l Sensory exam: revealed  reduced sensation to light touch in all fingers and throughout the Left upper extremity (C4-T1); no sensory deficits in R upper extremity or bilateral lower extremities Motor exam: strength 5/5 throughout bilateral upper extremities, bilateral lower extremities, and with exception of 5-/5 left hand grip  and 4+/5 bilateral finger abduction Coordination: Fine motor coordination was normal.    Gait: normal   Cervical Exam:  Inspection: Shoulder girdles were  even. The cervical lordotic curvature was  WNL.  Palpatory exam: revealed ttp at the cervicval paraspinals and L trapezius. There was no  muscle spasm. There were no trigger points noted.  Radiculopathy Special Tests: Spurling's maneuver : + L shooting sensation down arm  Myelopathy Special Tests: UMN signs: - Balance Impairement: - Lhermitte Sign: - Tinel's: + L hand into all fingers and up arm; - R hand, - bilateral cubital tunnels Reverse Phalen's: -  Zayda Dezarn is a 52 y.o. year old female  who  has a past medical history of Gallstones, No pertinent past medical history, and Obesity (BMI 30-39.9).   They are presenting to PM&R clinic as a follow up related to LUE neuropathic pain and neck pain, now also having similar R hand symptoms .  Chronic pain syndrome Chronic neck and L hand pain Stopped gabapentin due to no perceived benefit  Start Lyrica 50 mg twice daily.  After 2 weeks, call clinic or message me through MyChart to let me know if this is working for you.  If you are not having side effects, we will increase the dose.  If you are having side effects, we will wean you off and start a different medication.  Start physical therapy for your neck and shoulders. Referral sent today.   Cervical spine arthritis with nerve pain Left hand weakness -     Ambulatory referral to Physical Therapy -     MR CERVICAL SPINE WO CONTRAST; Future  Ongoing symptoms concerning for cervical myelopathy, especially since symptoms are now bilateral, +Spurling's on left, and developing left hand weakness.   NCS LUE with Dr. Wynn Banker 11/14/22 showed ?sensory polyneuropathy (only L hand tested), which could be from a variety of causes. However, given that she has no symptoms in the lower extremities, minimal symptoms in RUE, and above clinical picture still more consistent with cervical etiology, cautious to intiate extensive  workup based on only single limb study.   EMG showed L deltoid (C5-6) with 1+ PSW only, not seen on other muscles. Could indicate cervical radiculopathy, but not confirmed since only one muscle.   Given +Tinel's and nighttime symptoms, ?underling CTS effecting symptoms; reasonable to a neutral resting hand splint for use at nighttime on your left hand.  You will need to use this for 4 to 6 weeks every night to know if it is effective.  Obtain cervical MRI for left hand weakness and numbness/tingling to look for nerve damage.  If we are unable to get this, I will schedule you for a repeat EMG of both arms to see if we can get a better idea of possible nerve pinching and/or sensory polyneuropathy.  If we do not see improvements by next visit, we may refer you to neurosurgery.  Follow-up with me in 3 months.  Other orders -     Pregabalin; Take 1 capsule (50 mg total) by mouth 2 (two) times daily. After 2 weeks, call doctor for dose increase  Dispense: 60 capsule; Refill: 0

## 2022-12-23 ENCOUNTER — Encounter
Payer: Managed Care, Other (non HMO) | Attending: Physical Medicine and Rehabilitation | Admitting: Physical Medicine and Rehabilitation

## 2022-12-23 VITALS — BP 112/67 | HR 87 | Ht 62.0 in | Wt 232.0 lb

## 2022-12-23 DIAGNOSIS — M542 Cervicalgia: Secondary | ICD-10-CM | POA: Diagnosis present

## 2022-12-23 DIAGNOSIS — R29898 Other symptoms and signs involving the musculoskeletal system: Secondary | ICD-10-CM | POA: Diagnosis present

## 2022-12-23 DIAGNOSIS — G894 Chronic pain syndrome: Secondary | ICD-10-CM | POA: Diagnosis present

## 2022-12-23 DIAGNOSIS — M47812 Spondylosis without myelopathy or radiculopathy, cervical region: Secondary | ICD-10-CM | POA: Insufficient documentation

## 2022-12-23 DIAGNOSIS — G8929 Other chronic pain: Secondary | ICD-10-CM | POA: Diagnosis present

## 2022-12-23 DIAGNOSIS — M792 Neuralgia and neuritis, unspecified: Secondary | ICD-10-CM | POA: Insufficient documentation

## 2022-12-23 MED ORDER — PREGABALIN 50 MG PO CAPS: 50.0000 mg | ORAL_CAPSULE | Freq: Two times a day (BID) | ORAL | 0 refills | Status: AC

## 2022-12-23 NOTE — Patient Instructions (Signed)
Start Lyrica 50 mg twice daily.  After 2 weeks, call clinic or message me through MyChart to let me know if this is working for you.  If you are not having side effects, we will increase the dose.  If you are having side effects, we will wean you off and start a different medication.  Obtain a neutral resting hand splint for use at nighttime on your left hand.  You will need to use this for 4 to 6 weeks every night to know if it is effective.  Start physical therapy for your neck and shoulders.  Obtain cervical MRI for left hand weakness and numbness/tingling to look for nerve damage.  If we are unable to get this, I will schedule you for a repeat EMG of both arms to see if we can get a better idea of possible nerve pinching.  If we do not see improvements by next visit, we may refer you to neurosurgery.  Follow-up with me in 3 months.

## 2023-01-07 ENCOUNTER — Ambulatory Visit: Payer: Managed Care, Other (non HMO) | Attending: Physical Medicine and Rehabilitation | Admitting: Physical Therapy

## 2023-01-17 ENCOUNTER — Inpatient Hospital Stay: Admission: RE | Admit: 2023-01-17 | Payer: Managed Care, Other (non HMO) | Source: Ambulatory Visit

## 2023-03-06 ENCOUNTER — Ambulatory Visit: Payer: Managed Care, Other (non HMO) | Admitting: Nurse Practitioner

## 2023-03-07 ENCOUNTER — Ambulatory Visit: Payer: Managed Care, Other (non HMO) | Admitting: Nurse Practitioner

## 2023-03-10 NOTE — Progress Notes (Deleted)
Subjective:    Patient ID: [name], female    DOB: [birth year], 52 y.o.   MRN: [id number]  HPI   Pain Inventory Average Pain {NUMBERS; 0-10:5044} Pain Right Now {NUMBERS; 0-10:5044} My pain is {PAIN DESCRIPTION:21022940}  In the last 24 hours, has pain interfered with the following? General activity {NUMBERS; 0-10:5044} Relation with others {NUMBERS; 0-10:5044} Enjoyment of life {NUMBERS; 0-10:5044} What TIME of day is your pain at its worst? {time of day:24191} Sleep (in general) {BHH GOOD/FAIR/POOR:22877}  Pain is worse with: {ACTIVITIES:21022942} Pain improves with: {PAIN IMPROVES DQQI:29798921} Relief from Meds: {NUMBERS; 0-10:5044}  Family History  Problem Relation Age of Onset   Diabetes Mother    Heart disease Mother    Diabetes Father    Heart disease Father    Diabetes Paternal Grandmother    Diabetes Paternal Grandfather    Diabetes Maternal Grandmother    Diabetes Maternal Grandfather    Colon cancer Neg Hx    Rectal cancer Neg Hx    Stomach cancer Neg Hx    Esophageal cancer Neg Hx    Social History   Socioeconomic History   Marital status: Married    Spouse name: Not on file   Number of children: 4   Years of education: Not on file   Highest education level: Not on file  Occupational History   Not on file  Tobacco Use   Smoking status: Former    Current packs/day: 0.00    Types: Cigarettes    Quit date: 03/11/2011    Years since quitting: 12.0   Smokeless tobacco: Current    Types: Snuff  Vaping Use   Vaping status: Never Used  Substance and Sexual Activity   Alcohol use: No   Drug use: No   Sexual activity: Yes  Other Topics Concern   Not on file  Social History Narrative   Not on file   Social Drivers of Health   Financial Resource Strain: Not on file  Food Insecurity: Not on file  Transportation Needs: Not on file  Physical Activity: Not on file  Stress: Not on file  Social Connections: Not on file   Past Surgical  History:  Procedure Laterality Date   CHOLECYSTECTOMY  02/20/2011   Procedure: LAPAROSCOPIC CHOLECYSTECTOMY WITH INTRAOPERATIVE CHOLANGIOGRAM;  Surgeon: Kandis Cocking, MD;  Location: [facility] OR;  Service: General;  Laterality: N/A;   TUBAL LIGATION     Past Surgical History:  Procedure Laterality Date   CHOLECYSTECTOMY  02/20/2011   Procedure: LAPAROSCOPIC CHOLECYSTECTOMY WITH INTRAOPERATIVE CHOLANGIOGRAM;  Surgeon: Kandis Cocking, MD;  Location: [facility] OR;  Service: General;  Laterality: N/A;   TUBAL LIGATION     Past Medical History:  Diagnosis Date   Gallstones    No pertinent past medical history    Obesity (BMI 30-39.9)    LMP 08/22/2017 Comment: has had tubal ligation  Opioid Risk Score:   Fall Risk Score:  `1  Depression screen [facility] 2/9     11/14/2022    2:48 PM 09/16/2022    2:09 PM 09/04/2022    1:58 PM 08/07/2022    2:44 PM 08/06/2016   10:27 AM 05/30/2016    9:56 AM 05/16/2016    9:08 AM  Depression screen PHQ 2/9  Decreased Interest 0 1 2 0 0 0 0  Down, Depressed, Hopeless 0 0 0 0 0 1 0  PHQ - 2 Score 0 1 2 0 0 1 0  Altered sleeping  3 3 3 3  3  Tired, decreased energy  3 3 3 3 2    Change in appetite  3 3 3 3 2    Feeling bad or failure about yourself   0 0 0 0 0   Trouble concentrating  0 0 0 0 0   Moving slowly or fidgety/restless  0 0 0 0 0   Suicidal thoughts  0 1 0 0 0   PHQ-9 Score  10 12 9 9 8      Review of Systems     Objective:   Physical Exam        Assessment & Plan:

## 2023-03-12 ENCOUNTER — Encounter
Payer: Managed Care, Other (non HMO) | Attending: Physical Medicine and Rehabilitation | Admitting: Physical Medicine and Rehabilitation

## 2023-03-12 DIAGNOSIS — G894 Chronic pain syndrome: Secondary | ICD-10-CM | POA: Insufficient documentation

## 2023-03-12 DIAGNOSIS — G8929 Other chronic pain: Secondary | ICD-10-CM | POA: Insufficient documentation

## 2023-03-12 DIAGNOSIS — R29898 Other symptoms and signs involving the musculoskeletal system: Secondary | ICD-10-CM | POA: Insufficient documentation

## 2023-03-12 DIAGNOSIS — M542 Cervicalgia: Secondary | ICD-10-CM | POA: Insufficient documentation

## 2023-03-12 DIAGNOSIS — M47812 Spondylosis without myelopathy or radiculopathy, cervical region: Secondary | ICD-10-CM | POA: Insufficient documentation

## 2023-03-12 DIAGNOSIS — M792 Neuralgia and neuritis, unspecified: Secondary | ICD-10-CM | POA: Insufficient documentation

## 2023-04-09 ENCOUNTER — Other Ambulatory Visit: Payer: Self-pay

## 2023-04-09 ENCOUNTER — Encounter (HOSPITAL_COMMUNITY): Payer: Self-pay | Admitting: *Deleted

## 2023-04-09 ENCOUNTER — Ambulatory Visit (HOSPITAL_COMMUNITY)
Admission: EM | Admit: 2023-04-09 | Discharge: 2023-04-09 | Disposition: A | Discharge status: Home / Self Care | Payer: Managed Care, Other (non HMO)

## 2023-04-09 DIAGNOSIS — S90852A Superficial foreign body, left foot, initial encounter: Secondary | ICD-10-CM | POA: Diagnosis not present

## 2023-04-09 NOTE — ED Provider Notes (Signed)
[facility]    CSN: [id number] Arrival date & time: 04/09/23  1733      History   Chief Complaint Chief Complaint  Patient presents with   Foreign Body in Skin    HPI [name] is a 53 y.o. female. Stepped on something 3 weeks ago. It didn't hurt and she didn't know she had done it until she noticed it bleeding. Something was protruding from her foot - she does not know what it was. Her husband pulled it out but it is still sore and she thinks there is still something in there. Does not know what it was.   No significant erythema or swelling.   HPI  Past Medical History:  Diagnosis Date   Gallstones    No pertinent past medical history    Obesity (BMI 30-39.9)     Patient Active Problem List   Diagnosis Date Noted   Cervical spine arthritis with nerve pain 12/23/2022   Chronic pain syndrome 09/16/2022   Numbness and tingling in left arm 09/16/2022   Left hand weakness 09/16/2022   Chronic neck pain 09/16/2022   Vitamin D deficiency 08/12/2016   LUQ abdominal pain 12/14/2011   Cholecystitis, acute with cholelithiasis 02/20/2011   Obesity (BMI 30-39.9) 02/20/2011   Lower urinary tract infectious disease 02/20/2011    Past Surgical History:  Procedure Laterality Date   CHOLECYSTECTOMY  02/20/2011   Procedure: LAPAROSCOPIC CHOLECYSTECTOMY WITH INTRAOPERATIVE CHOLANGIOGRAM;  Surgeon: Kandis Cocking, MD;  Location: [facility] OR;  Service: General;  Laterality: N/A;   TUBAL LIGATION      OB History   No obstetric history on file.      Home Medications    Prior to Admission medications   Medication Sig Start Date End Date Taking? Authorizing Provider  atorvastatin (LIPITOR) 20 MG tablet Take 1 tablet (20 mg total) by mouth daily. 08/09/22  Yes Claiborne Rigg, NP  celecoxib (CELEBREX) 100 MG capsule Take 1 capsule (100 mg total) by mouth 2 (two) times daily. 09/04/22  Yes Claiborne Rigg, NP  predniSONE (DELTASONE) 10 MG tablet Take 4 tablets (40 mg  total) by mouth daily with breakfast for 5 days, THEN 3 tablets (30 mg total) daily with breakfast for 4 days, THEN 2 tablets (20 mg total) daily with breakfast for 4 days, THEN 1 tablet (10 mg total) daily with breakfast for 4 days, THEN 0.5 tablets (5 mg total) daily with breakfast for 4 days. 08/07/22  Yes Claiborne Rigg, NP  pregabalin (LYRICA) 50 MG capsule Take 1 capsule (50 mg total) by mouth 2 (two) times daily. After 2 weeks, call doctor for dose increase 12/23/22  Yes Engler, Bobetta Lime, DO  Vitamin D, Ergocalciferol, (DRISDOL) 1.25 MG (50000 UNIT) CAPS capsule Take 1 capsule (50,000 Units total) by mouth every 7 (seven) days. Take once a week. Please fill as a 90 day supply 08/09/22  Yes Claiborne Rigg, NP    Family History Family History  Problem Relation Age of Onset   Diabetes Mother    Heart disease Mother    Diabetes Father    Heart disease Father    Diabetes Paternal Grandmother    Diabetes Paternal Grandfather    Diabetes Maternal Grandmother    Diabetes Maternal Grandfather    Colon cancer Neg Hx    Rectal cancer Neg Hx    Stomach cancer Neg Hx    Esophageal cancer Neg Hx     Social History Social History  Tobacco Use   Smoking status: Former    Current packs/day: 0.00    Types: Cigarettes    Quit date: 03/11/2011    Years since quitting: 12.0   Smokeless tobacco: Current    Types: Snuff  Vaping Use   Vaping status: Never Used  Substance Use Topics   Alcohol use: No   Drug use: No     Allergies   Patient has no known allergies.   Review of Systems Review of Systems   Physical Exam Triage Vital Signs ED Triage Vitals  Encounter Vitals Group     BP 04/09/23 1841 125/84     Systolic BP Percentile --      Diastolic BP Percentile --      Pulse Rate 04/09/23 1841 75     Resp 04/09/23 1841 20     Temp 04/09/23 1841 98.3 F (36.8 C)     Temp src --      SpO2 04/09/23 1841 97 %     Weight --      Height --      Head Circumference --      Peak  Flow --      Pain Score 04/09/23 1840 5     Pain Loc --      Pain Education --      Exclude from Growth Chart --    No data found.  Updated Vital Signs BP 125/84   Pulse 75   Temp 98.3 F (36.8 C)   Resp 20   LMP 08/22/2017 Comment: has had tubal ligation  SpO2 97%   Visual Acuity Right Eye Distance:   Left Eye Distance:   Bilateral Distance:    Right Eye Near:   Left Eye Near:    Bilateral Near:     Physical Exam Constitutional:      General: She is not in acute distress.    Appearance: She is obese. She is not toxic-appearing.  Pulmonary:     Effort: Pulmonary effort is normal.  Musculoskeletal:       Feet:  Neurological:     Mental Status: She is alert.      UC Treatments / Results  Labs (all labs ordered are listed, but only abnormal results are displayed) Labs Reviewed - No data to display  EKG   Radiology No results found.  Procedures Procedures (including critical care time)  Medications Ordered in UC Medications - No data to display  Initial Impression / Assessment and Plan / UC Course  I have reviewed the triage vital signs and the nursing notes.  Pertinent labs & imaging results that were available during my care of the patient were reviewed by me and considered in my medical decision making (see chart for details).    No signs of active infection, so fb likely inorganic. Referred to podiatry for assistance with removal.   Final Clinical Impressions(s) / UC Diagnoses   Final diagnoses:  Foreign body in left foot, initial encounter     Discharge Instructions      Follow up with the foot clinic. They will be able to help you get out the splinter.     ED Prescriptions   None    PDMP not reviewed this encounter.   Cathlyn Parsons, NP 04/10/23 1255

## 2023-04-09 NOTE — Discharge Instructions (Signed)
 Follow up with the foot clinic. They will be able to help you get out the splinter.

## 2023-04-09 NOTE — ED Triage Notes (Signed)
 Pt reports a splinter in bottom of Lt foot for 3 weeks. Pt reports foot is still sore.

## 2023-04-14 ENCOUNTER — Encounter: Payer: Self-pay | Admitting: Podiatry

## 2023-04-14 ENCOUNTER — Ambulatory Visit (INDEPENDENT_AMBULATORY_CARE_PROVIDER_SITE_OTHER): Payer: Managed Care, Other (non HMO)

## 2023-04-14 ENCOUNTER — Ambulatory Visit: Payer: Managed Care, Other (non HMO) | Admitting: Podiatry

## 2023-04-14 DIAGNOSIS — S90852A Superficial foreign body, left foot, initial encounter: Secondary | ICD-10-CM

## 2023-04-14 DIAGNOSIS — L02612 Cutaneous abscess of left foot: Secondary | ICD-10-CM

## 2023-04-14 NOTE — Progress Notes (Unsigned)
   Chief Complaint  Patient presents with   Foot Pain    Plantar forefoot left - callused lesion x 3 weeks, went to Urgent Care - evaluated, thought she may had stepped on something but patient denies, very tender   New Patient (Initial Visit)    Est pt 2022    HPI: 53 y.o. female presenting today for new complaint of a foreign body to the plantar aspect of the left forefoot.  Associated pain and tenderness.  Patient was seen at the urgent care and referred here for further evaluation.  She does admit to walking around the house barefoot.  This has been ongoing for about 3-4 weeks now  Past Medical History:  Diagnosis Date   Gallstones    No pertinent past medical history    Obesity (BMI 30-39.9)     Past Surgical History:  Procedure Laterality Date   CHOLECYSTECTOMY  02/20/2011   Procedure: LAPAROSCOPIC CHOLECYSTECTOMY WITH INTRAOPERATIVE CHOLANGIOGRAM;  Surgeon: Kandis Cocking, MD;  Location: [facility];  Service: General;  Laterality: N/A;   TUBAL LIGATION      No Known Allergies   Physical Exam: General: The patient is alert and oriented x3 in no acute distress.  Dermatology: Foreign body embedded within the more superficial portion of the left forefoot.  There is a very small amount of localized purulence at the foreign body which is protruding out of the skin.  Associated tenderness to palpation. After removal of the foreign body and debridement of the area it is very superficial and stable.  There is no deeper underlying purulence or concern for a deeper abscess or cellulitis.  Vascular: Palpable pedal pulses bilaterally. Capillary refill within normal limits.  No appreciable edema.  No erythema.  Neurological: Grossly intact via light touch  Musculoskeletal Exam: No pedal deformities noted  Radiographic Exam LT foot 04/14/2023:  Normal osseous mineralization. Joint spaces preserved.  No fractures or osseous irregularities noted.  Impression: Negative  Assessment/Plan of  Care: 1.  Superficial foreign body with superficial localized purulent abscess left forefoot  -Patient evaluated.  X-rays reviewed -Debridement of the area was performed today using a tissue nipper and the foreign body was removed.  It appeared to be a wooden splinter approximately 5 mm in length.  The surrounding purulent and callused area was also debrided.  After debridement the superficial wound appears very stable with good potential for healing.  It does not extend into deeper subcutaneous tissue.  Antibiotic ointment and a Band-Aid was applied -Recommend antibiotic ointment and Band-Aid x 1 week -Stressed the importance of not going barefoot.  Recommend good supportive shoes and sneakers -Return to clinic as needed       Felecia Shelling, DPM [facility]  Dr. Felecia Shelling, DPM    [address] [place], [place] [address]                Office [phone]  Fax [phone]

## 2023-05-19 ENCOUNTER — Other Ambulatory Visit: Payer: Self-pay

## 2023-05-19 ENCOUNTER — Emergency Department (HOSPITAL_COMMUNITY)
Admission: EM | Admit: 2023-05-19 | Discharge: 2023-05-19 | Disposition: A | Discharge status: Home / Self Care | Payer: Managed Care, Other (non HMO) | Attending: Emergency Medicine | Admitting: Emergency Medicine

## 2023-05-19 ENCOUNTER — Encounter (HOSPITAL_COMMUNITY): Payer: Self-pay | Admitting: Emergency Medicine

## 2023-05-19 ENCOUNTER — Emergency Department (HOSPITAL_COMMUNITY): Payer: Managed Care, Other (non HMO)

## 2023-05-19 DIAGNOSIS — R0789 Other chest pain: Secondary | ICD-10-CM | POA: Diagnosis not present

## 2023-05-19 DIAGNOSIS — R079 Chest pain, unspecified: Secondary | ICD-10-CM | POA: Diagnosis present

## 2023-05-19 LAB — CBC
HCT: 41.5 % (ref 36.0–46.0)
Hemoglobin: 13.6 g/dL (ref 12.0–15.0)
MCH: 32.4 pg (ref 26.0–34.0)
MCHC: 32.8 g/dL (ref 30.0–36.0)
MCV: 98.8 fL (ref 80.0–100.0)
Platelets: 347 10*3/uL (ref 150–400)
RBC: 4.2 MIL/uL (ref 3.87–5.11)
RDW: 11.6 % (ref 11.5–15.5)
WBC: 5.9 10*3/uL (ref 4.0–10.5)
nRBC: 0 % (ref 0.0–0.2)

## 2023-05-19 LAB — TROPONIN I (HIGH SENSITIVITY)
Troponin I (High Sensitivity): 4 ng/L (ref <18)
Troponin I (High Sensitivity): 4 ng/L (ref <18)

## 2023-05-19 LAB — COMPREHENSIVE METABOLIC PANEL
ALT: 16 U/L (ref 0–44)
AST: 22 U/L (ref 15–41)
Albumin: 3.2 g/dL — ABNORMAL LOW (ref 3.5–5.0)
Alkaline Phosphatase: 74 U/L (ref 38–126)
Anion gap: 9 (ref 5–15)
BUN: 8 mg/dL (ref 6–20)
CO2: 26 mmol/L (ref 22–32)
Calcium: 8.6 mg/dL — ABNORMAL LOW (ref 8.9–10.3)
Chloride: 101 mmol/L (ref 98–111)
Creatinine, Ser: 0.86 mg/dL (ref 0.44–1.00)
GFR, Estimated: >60 mL/min (ref >60)
Glucose, Bld: 207 mg/dL — ABNORMAL HIGH (ref 70–99)
Potassium: 3.7 mmol/L (ref 3.5–5.1)
Sodium: 136 mmol/L (ref 135–145)
Total Bilirubin: 0.3 mg/dL (ref 0.0–1.2)
Total Protein: 7.3 g/dL (ref 6.5–8.1)

## 2023-05-19 MED ORDER — IBUPROFEN 800 MG PO TABS
800.0000 mg | ORAL_TABLET | Freq: Once | ORAL | Status: AC
  Administered 2023-05-19: 800 mg via ORAL
  Filled 2023-05-19: qty 1

## 2023-05-19 NOTE — Discharge Instructions (Signed)
 Evaluation today for chest pain was overall reassuring.  Suspect it could be musculoskeletal pain.  Treatment is supportive and you can take ibuprofen as well.  Please follow-up PCP.  If you chest pain worsens or changes in any way, you have worsening shortness of breath develop calf tenderness or swelling please return emergency department further evaluation.

## 2023-05-19 NOTE — ED Triage Notes (Signed)
 Pt in with pain underneath L breast, radiates to L posterior ribs, ongoing x 2wks. Pt reports nausea and sob

## 2023-05-19 NOTE — ED Provider Notes (Signed)
 [place] [facility] AT [facility] Provider Note   CSN: [id number] Arrival date & time: 05/19/23  0104     History  Chief Complaint  Patient presents with   Chest Pain   Abdominal Pain   HPI [name] is a 53 y.o. female presenting for chest pain.  Started 2 weeks ago.  States its under the left breast that radiates to the left mid back.  It is sharp pain.  It is worse with movement and it is reproducible.  Reports some intermittent shortness of breath as well.  Unsure if symptoms are worsened with exertion.  Not worse with deep breaths.  Denies cough and fever.  Denies any trauma to the chest.  States she has had this chest pain before.  Denies calf tenderness or swelling, OCP use, recent immobilization, or known coagulopathy.   Chest Pain Associated symptoms: abdominal pain   Abdominal Pain Associated symptoms: chest pain        Home Medications Prior to Admission medications   Medication Sig Start Date End Date Taking? Authorizing Provider  atorvastatin (LIPITOR) 20 MG tablet Take 1 tablet (20 mg total) by mouth daily. 08/09/22   Claiborne Rigg, NP  celecoxib (CELEBREX) 100 MG capsule Take 1 capsule (100 mg total) by mouth 2 (two) times daily. 09/04/22   Claiborne Rigg, NP  pregabalin (LYRICA) 50 MG capsule Take 1 capsule (50 mg total) by mouth 2 (two) times daily. After 2 weeks, call doctor for dose increase 12/23/22   Angelina Sheriff, DO  Vitamin D, Ergocalciferol, (DRISDOL) 1.25 MG (50000 UNIT) CAPS capsule Take 1 capsule (50,000 Units total) by mouth every 7 (seven) days. Take once a week. Please fill as a 90 day supply 08/09/22   Claiborne Rigg, NP      Allergies    Patient has no known allergies.    Review of Systems   Review of Systems  Cardiovascular:  Positive for chest pain.  Gastrointestinal:  Positive for abdominal pain.    Physical Exam   Vitals:   05/19/23 0645 05/19/23 0700  BP: (!) 95/49 (!) 110/50  Pulse: 85 76  Resp:  18 13  Temp:    SpO2: 100% 100%    CONSTITUTIONAL:  well-appearing, NAD NEURO:  Alert and oriented x 3, CN 3-12 grossly intact EYES:  eyes equal and reactive ENT/NECK:  Supple, no stridor  Chest: Left lateral chest wall tender with palpation.  No ecchymosis, step-off, crepitance noted. CARDIO:  regular rate and rhythm, appears well-perfused  PULM:  No respiratory distress, CTAB GI/GU:  non-distended, soft MSK/SPINE:  No gross deformities, no edema, moves all extremities  SKIN:  no rash, atraumatic  *Additional and/or pertinent findings included in MDM below  ED Results / Procedures / Treatments   Labs (all labs ordered are listed, but only abnormal results are displayed) Labs Reviewed  COMPREHENSIVE METABOLIC PANEL - Abnormal; Notable for the following components:      Result Value   Glucose, Bld 207 (*)    Calcium 8.6 (*)    Albumin 3.2 (*)    All other components within normal limits  CBC  TROPONIN I (HIGH SENSITIVITY)  TROPONIN I (HIGH SENSITIVITY)    EKG EKG Interpretation Date/Time:  Monday May 19 2023 01:21:04 EST Ventricular Rate:  84 PR Interval:  140 QRS Duration:  72 QT Interval:  380 QTC Calculation: 449 R Axis:   57  Text Interpretation: Normal sinus rhythm Normal ECG When compared with  ECG of 23-Jul-2022 01:54, PREVIOUS ECG IS PRESENT Confirmed by Kennis Carina [phone]) on 05/19/2023 6:58:07 AM  Radiology DG Chest 2 View Result Date: 05/19/2023 CLINICAL DATA:  Shortness of breath, left rib pain EXAM: CHEST - 2 VIEW COMPARISON:  07/23/2022 FINDINGS: Lungs are clear.  No pleural effusion or pneumothorax. The heart is normal in size. Visualized osseous structures are within normal limits. IMPRESSION: Normal chest radiographs. Electronically Signed   By: Charline Bills M.D.   On: 05/19/2023 01:49    Procedures Procedures    Medications Ordered in ED Medications  ibuprofen (ADVIL) tablet 800 mg (has no administration in time range)    ED Course/  Medical Decision Making/ A&P                                 Medical Decision Making Amount and/or Complexity of Data Reviewed Labs: ordered. Radiology: ordered.   Initial Impression and Ddx 53 yo well appearing female present for chest pain.  Exam notable for tenderness to the left lateral chest wall.  DDx includes ACS, PE, pneumothorax, costochondritis, other. Patient PMH that increases complexity of ED encounter:  reported h/o atypical chest pain  Interpretation of Diagnostics - I independent reviewed and interpreted the labs as followed: hyperglycemia (207)  - I independently visualized the following imaging with scope of interpretation limited to determining acute life threatening conditions related to emergency care: CXR, which revealed no acute findings  -I personally reviewed interpreted EKG which NSR  Patient Reassessment and Ultimate Disposition/Management On reassessment, patient stated that her chest pain had improved.  Workup overall unremarkable. Suspect MSK etiology, possibly costochondritis.  Considered PE but unlikely given lack of risk factors, normal vitals and nonpleuritic chest pain.  Workup not suggestive of ACS.  Advised NSAIDs at home and supportive treatment.  Also advised follow-up with PCP.  Discharged in good condition.  Patient management required discussion with the following services or consulting groups:  None  Complexity of Problems Addressed Acute complicated illness or Injury  Additional Data Reviewed and Analyzed Further history obtained from: Further history from spouse/family member, Past medical history and medications listed in the EMR, and Prior ED visit notes  Patient Encounter Risk Assessment None         Final Clinical Impression(s) / ED Diagnoses Final diagnoses:  Atypical chest pain    Rx / DC Orders ED Discharge Orders     None         Gareth Eagle, PA-C 05/19/23 0705    Sabas Sous, MD 05/19/23  [phone]
